# Patient Record
Sex: Female | Born: 1950
Health system: Southern US, Community
[De-identification: ages and names within clinical notes are randomized; demographics above are authoritative.]

## PROBLEM LIST (undated history)

## (undated) DIAGNOSIS — T7840XA Allergy, unspecified, initial encounter: Secondary | ICD-10-CM

## (undated) DIAGNOSIS — E039 Hypothyroidism, unspecified: Secondary | ICD-10-CM

## (undated) DIAGNOSIS — F329 Major depressive disorder, single episode, unspecified: Secondary | ICD-10-CM

## (undated) DIAGNOSIS — F32A Depression, unspecified: Secondary | ICD-10-CM

## (undated) DIAGNOSIS — F419 Anxiety disorder, unspecified: Secondary | ICD-10-CM

## (undated) DIAGNOSIS — E785 Hyperlipidemia, unspecified: Secondary | ICD-10-CM

## (undated) DIAGNOSIS — H269 Unspecified cataract: Secondary | ICD-10-CM

## (undated) DIAGNOSIS — Z860100 Personal history of colon polyps, unspecified: Secondary | ICD-10-CM

## (undated) HISTORY — DX: Personal history of colonic polyps: Z86.010

## (undated) HISTORY — DX: Hyperlipidemia, unspecified: E78.5

## (undated) HISTORY — DX: Depression, unspecified: F32.A

## (undated) HISTORY — DX: Unspecified cataract: H26.9

## (undated) HISTORY — DX: Personal history of colon polyps, unspecified: Z86.0100

## (undated) HISTORY — PX: POLYPECTOMY: SHX149

## (undated) HISTORY — DX: Major depressive disorder, single episode, unspecified: F32.9

## (undated) HISTORY — DX: Anxiety disorder, unspecified: F41.9

## (undated) HISTORY — DX: Allergy, unspecified, initial encounter: T78.40XA

## (undated) HISTORY — DX: Hypothyroidism, unspecified: E03.9

## (undated) HISTORY — PX: APPENDECTOMY: SHX54

## (undated) HISTORY — PX: COLONOSCOPY: SHX174

---

## 1987-01-15 HISTORY — PX: BREAST BIOPSY: SHX20

## 1987-01-15 HISTORY — PX: LOBECTOMY: SHX5089

## 2005-10-01 ENCOUNTER — Other Ambulatory Visit: Admission: RE | Admit: 2005-10-01 | Discharge: 2005-10-01 | Payer: Self-pay | Admitting: Obstetrics and Gynecology

## 2006-05-15 ENCOUNTER — Ambulatory Visit (HOSPITAL_COMMUNITY): Admission: RE | Admit: 2006-05-15 | Discharge: 2006-05-15 | Payer: Self-pay | Admitting: Obstetrics and Gynecology

## 2006-05-15 ENCOUNTER — Encounter (INDEPENDENT_AMBULATORY_CARE_PROVIDER_SITE_OTHER): Payer: Self-pay | Admitting: Specialist

## 2006-05-22 ENCOUNTER — Ambulatory Visit: Payer: Self-pay | Admitting: Internal Medicine

## 2006-05-23 LAB — CONVERTED CEMR LAB
ALT: 25 units/L (ref 0–40)
AST: 28 units/L (ref 0–37)
Albumin: 3.9 g/dL (ref 3.5–5.2)
Basophils Absolute: 0 10*3/uL (ref 0.0–0.1)
Bilirubin, Direct: 0.1 mg/dL (ref 0.0–0.3)
Calcium: 9.3 mg/dL (ref 8.4–10.5)
Chloride: 109 meq/L (ref 96–112)
Cholesterol: 168 mg/dL (ref 0–200)
Eosinophils Absolute: 0.2 10*3/uL (ref 0.0–0.6)
GFR calc Af Amer: 133 mL/min
GFR calc non Af Amer: 110 mL/min
HDL: 50.1 mg/dL (ref >39.0)
Lymphocytes Relative: 35.8 % (ref 12.0–46.0)
MCHC: 35.2 g/dL (ref 30.0–36.0)
MCV: 87.8 fL (ref 78.0–100.0)
Neutro Abs: 2.1 10*3/uL (ref 1.4–7.7)
Platelets: 209 10*3/uL (ref 150–400)
RBC: 4.11 M/uL (ref 3.87–5.11)
Sodium: 143 meq/L (ref 135–145)
TSH: 5.59 microintl units/mL — ABNORMAL HIGH (ref 0.35–5.50)
Total CHOL/HDL Ratio: 3.4
Triglycerides: 89 mg/dL (ref 0–149)

## 2006-10-16 ENCOUNTER — Encounter: Payer: Self-pay | Admitting: Internal Medicine

## 2006-11-24 ENCOUNTER — Ambulatory Visit: Payer: Self-pay | Admitting: Internal Medicine

## 2006-11-24 DIAGNOSIS — E785 Hyperlipidemia, unspecified: Secondary | ICD-10-CM | POA: Insufficient documentation

## 2006-11-24 DIAGNOSIS — F329 Major depressive disorder, single episode, unspecified: Secondary | ICD-10-CM

## 2006-11-24 DIAGNOSIS — Z860100 Personal history of colon polyps, unspecified: Secondary | ICD-10-CM | POA: Insufficient documentation

## 2006-11-24 DIAGNOSIS — J309 Allergic rhinitis, unspecified: Secondary | ICD-10-CM | POA: Insufficient documentation

## 2006-11-24 DIAGNOSIS — F411 Generalized anxiety disorder: Secondary | ICD-10-CM | POA: Insufficient documentation

## 2006-11-24 DIAGNOSIS — E039 Hypothyroidism, unspecified: Secondary | ICD-10-CM | POA: Insufficient documentation

## 2006-11-24 DIAGNOSIS — M810 Age-related osteoporosis without current pathological fracture: Secondary | ICD-10-CM | POA: Insufficient documentation

## 2006-11-24 LAB — CONVERTED CEMR LAB
HDL: 64.7 mg/dL (ref >39.0)
TSH: 2.2 microintl units/mL (ref 0.35–5.50)
Total CHOL/HDL Ratio: 3.8
Triglycerides: 77 mg/dL (ref 0–149)

## 2006-12-01 ENCOUNTER — Telehealth: Payer: Self-pay | Admitting: Internal Medicine

## 2006-12-08 ENCOUNTER — Encounter: Payer: Self-pay | Admitting: Internal Medicine

## 2006-12-15 ENCOUNTER — Encounter: Payer: Self-pay | Admitting: Internal Medicine

## 2007-05-18 ENCOUNTER — Ambulatory Visit: Payer: Self-pay | Admitting: Internal Medicine

## 2007-05-18 LAB — CONVERTED CEMR LAB
HDL: 70.2 mg/dL (ref >39.0)
TSH: 1.61 microintl units/mL (ref 0.35–5.50)
Total CHOL/HDL Ratio: 2.5
VLDL: 12 mg/dL (ref 0–40)

## 2007-05-28 ENCOUNTER — Telehealth: Payer: Self-pay | Admitting: Internal Medicine

## 2007-07-27 ENCOUNTER — Encounter: Payer: Self-pay | Admitting: Internal Medicine

## 2008-02-01 ENCOUNTER — Encounter: Payer: Self-pay | Admitting: Internal Medicine

## 2008-02-10 ENCOUNTER — Ambulatory Visit: Payer: Self-pay | Admitting: Internal Medicine

## 2008-06-09 ENCOUNTER — Telehealth: Payer: Self-pay | Admitting: Internal Medicine

## 2008-06-17 ENCOUNTER — Encounter: Payer: Self-pay | Admitting: Internal Medicine

## 2008-06-30 ENCOUNTER — Telehealth: Payer: Self-pay | Admitting: Internal Medicine

## 2008-08-15 ENCOUNTER — Ambulatory Visit: Payer: Self-pay | Admitting: Internal Medicine

## 2008-09-27 ENCOUNTER — Telehealth: Payer: Self-pay | Admitting: Internal Medicine

## 2009-02-15 ENCOUNTER — Ambulatory Visit: Payer: Self-pay | Admitting: Internal Medicine

## 2009-08-30 ENCOUNTER — Ambulatory Visit: Payer: Self-pay | Admitting: Family Medicine

## 2009-08-30 DIAGNOSIS — N390 Urinary tract infection, site not specified: Secondary | ICD-10-CM

## 2009-08-30 LAB — CONVERTED CEMR LAB
Glucose, Urine, Semiquant: NEGATIVE
Nitrite: NEGATIVE
Specific Gravity, Urine: 1.02
pH: 7

## 2009-09-01 ENCOUNTER — Telehealth: Payer: Self-pay | Admitting: Internal Medicine

## 2009-09-11 ENCOUNTER — Ambulatory Visit: Payer: Self-pay | Admitting: Internal Medicine

## 2009-09-11 ENCOUNTER — Telehealth: Payer: Self-pay | Admitting: Internal Medicine

## 2009-09-27 ENCOUNTER — Telehealth: Payer: Self-pay | Admitting: Internal Medicine

## 2009-10-27 ENCOUNTER — Telehealth: Payer: Self-pay | Admitting: Internal Medicine

## 2009-10-30 ENCOUNTER — Encounter: Payer: Self-pay | Admitting: Internal Medicine

## 2010-01-31 ENCOUNTER — Telehealth: Payer: Self-pay | Admitting: Speech Pathology

## 2010-02-02 ENCOUNTER — Telehealth: Payer: Self-pay | Admitting: Internal Medicine

## 2010-02-03 ENCOUNTER — Encounter: Payer: Self-pay | Admitting: *Deleted

## 2010-02-04 ENCOUNTER — Encounter: Payer: Self-pay | Admitting: *Deleted

## 2010-02-05 ENCOUNTER — Ambulatory Visit: Admit: 2010-02-05 | Payer: Self-pay | Admitting: Internal Medicine

## 2010-02-13 NOTE — Progress Notes (Signed)
Summary: alendronate question  Call back at [facility] Phone [phone]   Summary of Call: Fosamax has been recalled according to TV.  Take Alendronate, a form of Fosamax.  Is it also recalled or should I drop it? Initial call taken by: Rudy Jew, RN,  September 27, 2009 1:55 PM  Follow-up for Phone Call       Follow-up by: Rudy Jew, RN,  September 29, 2009 8:09 AM    lunaware of any recall -OK to continue until further notice

## 2010-02-13 NOTE — Progress Notes (Signed)
Summary: Pt req lab results from [facility]a.  Call back at [facility] Phone [phone]   Caller: Patient Summary of Call: Pt called and is req her lab results from [facility]. Pls call back today. If a med has been called in for a uti, pls be sure to use [facility] on [address].   Initial call taken by: Lucy Antigua,  September 01, 2009 11:14 AM  Follow-up for Phone Call        Pt is asking for lab results. [phone] Follow-up by: Lynann Beaver CMA,  September 01, 2009 2:59 PM    Additional Follow-up for Phone Call Additional follow up Details #2::    pt aware [facility]. KIk Follow-up by: Duard Brady LPN,  September 01, 2009 5:33 PM  New/Updated Medications: CIPRO 500 MG TABS (CIPROFLOXACIN HCL) 1 by mouth bid Prescriptions: CIPRO 500 MG TABS (CIPROFLOXACIN HCL) 1 by mouth bid  #14 x 0   Entered by:   Duard Brady LPN   Authorized by:   Gordy Savers  MD   Signed by:   Duard Brady LPN on 29/56/2130   Method used:   Faxed to ...       [facility] # [address] (retail)       [address]       [place], [place]  [address]       Ph: [phone]       Fax: [phone]   RxID:   [id number] CIPRO 500 MG TABS (CIPROFLOXACIN HCL) 1 by mouth bid  #140 x 0   Entered by:   Duard Brady LPN   Authorized by:   Gordy Savers  MD   Signed by:   Duard Brady LPN on 63/87/5643   Method used:   Faxed to ...       [facility] # [address] (retail)       [address]       [place], [place]  [address]       Ph: [phone]       Fax: [phone]   RxID:   [id number]  generic cipro 500  #14 one BID

## 2010-02-13 NOTE — Letter (Signed)
Summary: Patient going to [facility] for now  Patient going to [facility] for now   Imported By: Maryln Gottron 11/07/2009 12:28:09  _____________________________________________________________________  External Attachment:    Type:   Image     Comment:   External Document

## 2010-02-13 NOTE — Assessment & Plan Note (Signed)
Summary: 6 month rov/njr   Vital Signs:  Patient profile:   60 year old female Weight:      114 pounds Temp:     98.1 degrees F oral BP sitting:   112 / 74  (left arm) Cuff size:   regular  Vitals Entered By: Duard Brady LPN (September 11, 2009 12:40 PM) CC: 6 mos rov - doing well Is Patient Diabetic? No   CC:  6 mos rov - doing well.  History of Present Illness: 60 year old patient who has a history of hypothyroidism, chronic depression, and dyslipidemia.  She is done quite well.  No concerns or complaints today.  She's had no laboratory test in over one year.  She continues to be without health insurance.  Her depression has been stable.  She continues to tolerate her simvastatin without difficulty.  Preventive Screening-Counseling & Management  Alcohol-Tobacco     Smoking Status: never  Allergies (verified): No Known Drug Allergies  Past History:  Past Medical History: Reviewed history from 11/24/2006 and no changes required. Allergic rhinitis Anxiety Colonic polyps, hx of Depression Hyperlipidemia Hypothyroidism Osteoporosis  Social History: Smoking Status:  never  Review of Systems  The patient denies anorexia, fever, weight loss, weight gain, vision loss, decreased hearing, hoarseness, chest pain, syncope, dyspnea on exertion, peripheral edema, prolonged cough, headaches, hemoptysis, abdominal pain, melena, hematochezia, severe indigestion/heartburn, hematuria, incontinence, genital sores, muscle weakness, suspicious skin lesions, transient blindness, difficulty walking, depression, unusual weight change, abnormal bleeding, enlarged lymph nodes, angioedema, and breast masses.    Physical Exam  General:  Well-developed,well-nourished,in no acute distress; alert,appropriate and cooperative throughout examination Head:  Normocephalic and atraumatic without obvious abnormalities. No apparent alopecia or balding. Eyes:  No corneal or conjunctival inflammation  noted. EOMI. Perrla. Funduscopic exam benign, without hemorrhages, exudates or papilledema. Vision grossly normal. Mouth:  Oral mucosa and oropharynx without lesions or exudates.  Teeth in good repair. Neck:  No deformities, masses, or tenderness noted. Lungs:  Normal respiratory effort, chest expands symmetrically. Lungs are clear to auscultation, no crackles or wheezes. Heart:  Normal rate and regular rhythm. S1 and S2 normal without gallop, murmur, click, rub or other extra sounds. Abdomen:  Bowel sounds positive,abdomen soft and non-tender without masses, organomegaly or hernias noted. Msk:  No deformity or scoliosis noted of thoracic or lumbar spine.   Pulses:  R and L carotid,radial,femoral,dorsalis pedis and posterior tibial pulses are full and equal bilaterally Extremities:  No clubbing, cyanosis, edema, or deformity noted with normal full range of motion of all joints.     Impression & Recommendations:  Problem # 1:  HYPOTHYROIDISM (ICD-244.9)  Her updated medication list for this problem includes:    Levothyroxine Sodium 50 Mcg Tabs (Levothyroxine sodium) .Marland Kitchen... 1 tablet by mouth once a day    Her updated medication list for this problem includes:    Levothyroxine Sodium 50 Mcg Tabs (Levothyroxine sodium) .Marland Kitchen... 1 tablet by mouth once a day  Problem # 2:  HYPERLIPIDEMIA (ICD-272.4)  Her updated medication list for this problem includes:    Simvastatin 80 Mg Tabs (Simvastatin) .Marland Kitchen... 1/2 once daily    Her updated medication list for this problem includes:    Simvastatin 80 Mg Tabs (Simvastatin) .Marland Kitchen... 1/2 once daily  Problem # 3:  DEPRESSION (ICD-311)  Her updated medication list for this problem includes:    Fluoxetine Hcl 40 Mg Caps (Fluoxetine hcl) ..... One daily    Her updated medication list for this problem includes:    Fluoxetine  Hcl 40 Mg Caps (Fluoxetine hcl) ..... One daily  Complete Medication List: 1)  Aspirin 81 Mg Tbec (Aspirin) .... Take 1 tablet by  mouth every morning 2)  Levothyroxine Sodium 50 Mcg Tabs (Levothyroxine sodium) .Marland Kitchen.. 1 tablet by mouth once a day 3)  Simvastatin 80 Mg Tabs (Simvastatin) .... 1/2 once daily 4)  Fluoxetine Hcl 40 Mg Caps (Fluoxetine hcl) .... One daily 5)  Alendronate Sodium 70 Mg Tabs (Alendronate sodium) .... One weekly 6)  Cipro 500 Mg Tabs (Ciprofloxacin hcl) .Marland Kitchen.. 1 by mouth bid  Other Orders: Venipuncture ([address]) TLB-TSH (Thyroid Stimulating Hormone) (84443-TSH) TLB-BMP (Basic Metabolic Panel-BMET) (80048-METABOL) TLB-Hepatic/Liver Function Pnl (80076-HEPATIC)  Patient Instructions: 1)  Please schedule a follow-up appointment in 6 months. 2)  It is important that you exercise regularly at least 20 minutes 5 times a week. If you develop chest pain, have severe difficulty breathing, or feel very tired , stop exercising immediately and seek medical attention. 3)  Schedule your mammogram. 4)  Take calcium +Vitamin D daily. Prescriptions: ALENDRONATE SODIUM 70 MG TABS (ALENDRONATE SODIUM) one weekly  #12 x 6   Entered and Authorized by:   Gordy Savers  MD   Signed by:   Gordy Savers  MD on 09/11/2009   Method used:   [facility]ronically to        [facility] # [id number]* (retail)       [address]       [place], [place]  [address]       Ph: [phone]       Fax: [phone]   RxID:   [id number] FLUOXETINE HCL 40 MG CAPS (FLUOXETINE HCL) one daily  #90 x 6   Entered and Authorized by:   Gordy Savers  MD   Signed by:   Gordy Savers  MD on 09/11/2009   Method used:   Electronically to        [facility] # [id number]* (retail)       [address]       [place], [place]  [address]       Ph: [phone]       Fax: [phone]   RxID:   [id number] SIMVASTATIN 80 MG  TABS (SIMVASTATIN) 1/2 once daily  #90 x 4   Entered and Authorized by:   Gordy Savers  MD   Signed by:   Gordy Savers  MD on 09/11/2009   Method used:    Electronically to        [facility] # [address]* (retail)       [address]       [place], [place]  [address]       Ph: [phone]       Fax: [phone]   RxID:   [id number] LEVOTHYROXINE SODIUM 50 MCG TABS (LEVOTHYROXINE SODIUM) 1 tablet by mouth once a day  #90 x 4   Entered and Authorized by:   Gordy Savers  MD   Signed by:   Gordy Savers  MD on 09/11/2009   Method used:   Electronically to        [facility] # [address] (retail)       [address]       [place], [place]  [address]       Ph: [phone]       Fax: [phone]   RxID:   [id number]

## 2010-02-13 NOTE — Progress Notes (Signed)
Summary: taper medication  Phone Note Call from Patient Call back at Home Phone [phone]   Caller: Patient Call For: Gordy Savers  MD Summary of Call: pt would like kim to call her back concerning taking a break for fluoxetine Initial call taken by: Heron Sabins,  October 27, 2009 9:23 AM  Follow-up for Phone Call        OK to taper to 20 mg for 2 weeks and give trial off med but to resume if signs or relapse Follow-up by: Gordy Savers  MD,  October 27, 2009 5:04 PM  Additional Follow-up for Phone Call Additional follow up Details #1::        called pt to discuss taper med. KIK Additional Follow-up by: Duard Brady LPN,  October 30, 2009 10:28 AM

## 2010-02-13 NOTE — Assessment & Plan Note (Signed)
Summary: 27mo rov/mm   Vital Signs:  Tammy [name] profile:   60 year old female Weight:      112 pounds BMI:     22.70 Temp:     98.6 degrees F oral BP sitting:   80 / 50  (left arm) Cuff size:   regular  Vitals Entered By: Raechel Ache, RN (February 15, 2009 11:36 AM) CC: 6 mo ROV   CC:  6 mo ROV.  History of Present Illness: 60 year old Tammy [name] who is seen today for follow-up of her osteoporosis, hypothyroidism, and dyslipidemia.  She  remains without health insurance.  She is doing quite well without medical concerns or complaints.  She is still unemployed searching for a job  Allergies: No Known Drug Allergies  Past History:  Past Medical History: Reviewed history from 11/24/2006 and no changes required. Allergic rhinitis Anxiety Colonic polyps, hx of Depression Hyperlipidemia Hypothyroidism Osteoporosis  Past Surgical History: Reviewed history from 11/24/2006 and no changes required. gravida one, para one, abortus zero C-section 1988 Appendectomy lobectomy 1989  Social History: Single presently unemployed, without health insurance  Review of Systems  The Tammy [name] denies anorexia, fever, weight loss, weight gain, vision loss, decreased hearing, hoarseness, chest pain, syncope, dyspnea on exertion, peripheral edema, prolonged cough, headaches, hemoptysis, abdominal pain, melena, hematochezia, severe indigestion/heartburn, hematuria, incontinence, genital sores, muscle weakness, suspicious skin lesions, transient blindness, difficulty walking, depression, unusual weight change, abnormal bleeding, enlarged lymph nodes, angioedema, and breast masses.    Physical Exam  General:  Well-developed,well-nourished,in no acute distress; alert,appropriate and cooperative throughout examination; blood pressure low normal Head:  Normocephalic and atraumatic without obvious abnormalities. No apparent alopecia or balding. Eyes:  No corneal or conjunctival inflammation noted.  EOMI. Perrla. Funduscopic exam benign, without hemorrhages, exudates or papilledema. Vision grossly normal. Mouth:  Oral mucosa and oropharynx without lesions or exudates.  Teeth in good repair. Neck:  No deformities, masses, or tenderness noted. Lungs:  Normal respiratory effort, chest expands symmetrically. Lungs are clear to auscultation, no crackles or wheezes. Heart:  Normal rate and regular rhythm. S1 and S2 normal without gallop, murmur, click, rub or other extra sounds. Abdomen:  Bowel sounds positive,abdomen soft and non-tender without masses, organomegaly or hernias noted.   Impression & Recommendations:  Problem # 1:  HYPOTHYROIDISM (ICD-244.9)  Her updated medication list for this problem includes:    Levothyroxine Sodium 50 Mcg Tabs (Levothyroxine sodium) .Marland Kitchen... 1 tablet by mouth once a day  Her updated medication list for this problem includes:    Levothyroxine Sodium 50 Mcg Tabs (Levothyroxine sodium) .Marland Kitchen... 1 tablet by mouth once a day  Problem # 2:  HYPERLIPIDEMIA (ICD-272.4)  Her updated medication list for this problem includes:    Simvastatin 80 Mg Tabs (Simvastatin) .Marland Kitchen... 1/2 once daily    Her updated medication list for this problem includes:    Simvastatin 80 Mg Tabs (Simvastatin) .Marland Kitchen... 1/2 once daily  Orders: Prescription Created Electronically [id number])  Complete Medication List: 1)  Aspirin 81 Mg Tbec (Aspirin) .... Take 1 tablet by mouth every morning 2)  Levothyroxine Sodium 50 Mcg Tabs (Levothyroxine sodium) .Marland Kitchen.. 1 tablet by mouth once a day 3)  Simvastatin 80 Mg Tabs (Simvastatin) .... 1/2 once daily 4)  Bactrim Ds 800-160 Mg Tabs (Sulfamethoxazole-trimethoprim) .... One tablet twice daily as needed 5)  Fluoxetine Hcl 40 Mg Caps (Fluoxetine hcl) .... One daily 6)  Alendronate Sodium 70 Mg Tabs (Alendronate sodium) .... One weekly  Tammy [name] Instructions: 1)  Please schedule a follow-up  appointment in 6 months. 2)  It is important that you exercise  regularly at least 20 minutes 5 times a week. If you develop chest pain, have severe difficulty breathing, or feel very tired , stop exercising immediately and seek medical attention. Prescriptions: ALENDRONATE SODIUM 70 MG TABS (ALENDRONATE SODIUM) one weekly  #12 x 6   Entered and Authorized by:   Gordy Savers  MD   Signed by:   Gordy Savers  MD on 02/15/2009   Method used:   Print then Give to Tammy [name]   RxID:   [id number] FLUOXETINE HCL 40 MG CAPS (FLUOXETINE HCL) one daily  #90 x 6   Entered and Authorized by:   Gordy Savers  MD   Signed by:   Gordy Savers  MD on 02/15/2009   Method used:   Print then Give to Tammy [name]   RxID:   [id number] SIMVASTATIN 80 MG  TABS (SIMVASTATIN) 1/2 once daily  #90 x 4   Entered and Authorized by:   Gordy Savers  MD   Signed by:   Gordy Savers  MD on 02/15/2009   Method used:   Print then Give to Tammy [name]   RxID:   [id number] LEVOTHYROXINE SODIUM 50 MCG TABS (LEVOTHYROXINE SODIUM) 1 tablet by mouth once a day  #90 x 4   Entered and Authorized by:   Gordy Savers  MD   Signed by:   Gordy Savers  MD on 02/15/2009   Method used:   Print then Give to Tammy [name]   RxID:   [id number] ALENDRONATE SODIUM 70 MG TABS (ALENDRONATE SODIUM) one weekly  #12 x 6   Entered and Authorized by:   Gordy Savers  MD   Signed by:   Gordy Savers  MD on 02/15/2009   Method used:   Electronically to        [facility] # [address]* (retail)       [address]       [place], [place]  [address]       Ph: [phone]       Fax: [phone]   RxID:   [id number] FLUOXETINE HCL 40 MG CAPS (FLUOXETINE HCL) one daily  #90 x 6   Entered and Authorized by:   Gordy Savers  MD   Signed by:   Gordy Savers  MD on 02/15/2009   Method used:   Electronically to        [facility] # 2108* (retail)       [address]       [place], [place]   [address]       Ph: [phone]       Fax: [phone]   RxID:   [id number] SIMVASTATIN 80 MG  TABS (SIMVASTATIN) 1/2 once daily  #90 x 4   Entered and Authorized by:   Gordy Savers  MD   Signed by:   Gordy Savers  MD on 02/15/2009   Method used:   Electronically to        [facility] # [address]* (retail)       [address]       [place], [place]  [address]       Ph: [phone]       Fax: [phone]   RxID:   [id number] LEVOTHYROXINE SODIUM 50 MCG TABS (LEVOTHYROXINE SODIUM) 1 tablet by mouth once a day  #90 x 4  Entered and Authorized by:   Gordy Savers  MD   Signed by:   Gordy Savers  MD on 02/15/2009   Method used:   Electronically to        [facility] # [address] (retail)       [address]       [place], [place]  [address]       Ph: [phone]       Fax: [phone]   RxID:   [id number]

## 2010-02-13 NOTE — Progress Notes (Signed)
Summary: wont be getting labs  Phone Note Outgoing Call   Call placed by: Rita Ohara Call placed to: Patient Summary of Call: Patient said she couldn't afford labs right now so she will not be returning back for them. Initial call taken by: Duard Brady LPN,  September 11, 2009 5:26 PM  Follow-up for Phone Call        noted Follow-up by: Gordy Savers  MD,  September 11, 2009 5:32 PM

## 2010-02-15 NOTE — Progress Notes (Signed)
Summary: Pt cx her sch dexa scan appt. Too Expensive  Phone Note Call from Patient   Caller: Patient Summary of Call: Pt called and had been sch for dexa scan on 02/05/10 at 11:30am. Pt called back and cx the dexa scan because she said that it is too expensive and she does not have insurance. According to [facility], the cost is $400.    Initial call taken by: Lucy Antigua,  February 02, 2010 11:36 AM  Follow-up for Phone Call        OK to cancel and reschedule later Follow-up by: Gordy Savers  MD,  February 02, 2010 5:02 PM

## 2010-02-15 NOTE — Progress Notes (Signed)
Summary: wants referral  Phone Note Call from Patient Call back at Home Phone [phone]   Caller: Patient---live call Summary of Call: pt would like orders to have a colonosco-py and dexa done. please send orders. Initial call taken by: Warnell Forester,  January 31, 2010 1:33 PM  Follow-up for Phone Call        ok Follow-up by: Gordy Savers  MD,  February 01, 2010 12:40 PM  Additional Follow-up for Phone Call Additional follow up Details #1::        orders done and sent to Nadie Fiumara   [facility] Additional Follow-up by: Duard Brady LPN,  February 01, 2010 2:23 PM

## 2010-03-11 ENCOUNTER — Encounter: Payer: Self-pay | Admitting: Internal Medicine

## 2010-03-12 ENCOUNTER — Encounter (INDEPENDENT_AMBULATORY_CARE_PROVIDER_SITE_OTHER): Payer: Self-pay | Admitting: Internal Medicine

## 2010-03-12 NOTE — Progress Notes (Signed)
  Subjective:    Patient ID: Tammy [name], female    DOB: 1950-05-31, 60 y.o.   MRN: [id number]  HPI    Review of Systems     Objective:   Physical Exam        Assessment & Plan:

## 2010-05-15 ENCOUNTER — Other Ambulatory Visit: Payer: Self-pay | Admitting: Internal Medicine

## 2010-05-15 DIAGNOSIS — Z1231 Encounter for screening mammogram for malignant neoplasm of breast: Secondary | ICD-10-CM

## 2010-05-29 NOTE — Assessment & Plan Note (Signed)
[facility]                            [facility] OFFICE NOTE   NAME:Tammy [name], Tammy [name]                          MRN:          [id number]  DATE:05/22/2006                            DOB:          24-Feb-1950    A 60 year old female seen today to establish with our practice.  She has  a long history of stable depression, osteoporosis, seasonal allergic  rhinitis.  She has chronic polyps and a history of hypothyroidism.  She  is followed locally by Dr. Tenny Craw and also with [facility].  She is gravida 1 para 1 abortus 0.  Status post C-  section in 1988.  She has had a remote appendectomy in 1965, and a  lumpectomy for benign disease in 1989.   SHE HAS NO KNOWN ALLERGY.   MEDICAL REGIMEN:  Reviewed.   REVIEW OF SYSTEMS:  Exam was negative.  Her last colonoscopy was in the  summer of 2007.   SOCIAL HISTORY:  She is married, relocated from [place], [place].  One son age 46.   FAMILY HISTORY:  Both parents are living, father age 36 with arthritis,  gout, and a history of hypercholesterolemia, mother with hypothyroidism,  osteoporosis, and hypercholesterolemia.  No siblings.   EXAMINATION:  Revealed a well-developed, thin, white female, in no acute  distress.  Blood pressure is 110/70.  Fundi, ears, nose, and throat normal.  NECK:  No bruits or adenopathy.  CHEST:  Was clear.  CARDIOVASCULAR EXAM:  Normal heart sounds.  No murmurs.  ABDOMEN:  Benign.  No bruits, organomegaly, or masses.  EXTREMITIES:  Revealed intact peripheral pulses.  NEUROLOGIC:  Negative.   IMPRESSION:  1. Osteoporosis.  2. Hypothyroidism.  3. Hypercholesterolemia.   DISPOSITION:  1. She will return at her convenience for fasting panel.  2. Medical regimen unchanged.  3. We will reassess in 6 months.     Gordy Savers, MD  Electronically Signed    PFK/MedQ  DD: 05/22/2006  DT: 05/22/2006  Job #: [id number]

## 2010-05-29 NOTE — Op Note (Signed)
NAME:  Tammy [name], MOILANEN                 ACCOUNT NO.:  [id number]   MEDICAL RECORD NO.:  [id number]          PATIENT TYPE:  AMB   LOCATION:  [facility]                           FACILITY:  [facility]   PHYSICIAN:  Miguel Aschoff, M.D.       DATE OF BIRTH:  1950/11/30   DATE OF PROCEDURE:  05/15/2006  DATE OF DISCHARGE:                               OPERATIVE REPORT   PREOPERATIVE DIAGNOSIS:  Post menopausal bleeding.   POSTOPERATIVE DIAGNOSES:  1. Atrophic endometritis.  2. Atrophic vaginitis.   SURGEON:  Miguel Aschoff, M.D.   ANESTHESIA:  General anesthesia.   COMPLICATIONS:  None.   INDICATIONS FOR PROCEDURE:  The patient is a 60 year old white female  with a history of post menopausal bleeding with no regular menses now  for several years.  In view of the irregular bleeding, she presents now  to undergo hysteroscopy and D&C in an effort to identify the source of  bleeding and correct it.  The risks and limitations of the procedure  were discussed with the patient.   DESCRIPTION OF PROCEDURE:  Patient was taken to the operating room,  placed in the supine position.  General anesthesia was administered  without difficulty.  She was then placed in the dorsal lithotomy  position, prepped and draped in the usual sterile fashion.  Examination  under anesthesia revealed normal external genitalia, normal Bartholin's  and Skene's glands.  Vaginal vault revealed signs of atrophy, most  noticeable at the apex of the vagina and reflection of the vagina to the  cervix.  No gross lesions were noted other than there is atrophy which  appeared to be erythematous and when swabbed, bled easily.  The cervix  contained no gross lesions.  The uterus was small, regular size and  shape.  The adnexa revealed no masses.  At this point, speculum was  placed into the vaginal vault.  The anterior cervical lip was grasped  with the tenaculum.  The endocervical canal was dilated until a #23  Pratt dilator could be passed.  At  this point, the diagnostic  hysteroscope was advanced through the endocervical canal.  No  endocervical lesions were noted.  Inspection of the endometrial cavity  revealed the cavity to be significantly atrophic with areas of petechial  hemorrhage.  There were no polyps or submucous myomas.  Findings within  the cavity appear to be consistent with atrophic endometritis.  At this  point, with no other abnormalities being noted, the hysteroscope was  removed.  Vigorous sharp curettage was carried out and the scan amount  of tissue that was obtained was sent for histologic study.   ESTIMATED BLOOD LOSS:  Approximately 10 mL.   Following completion of the curettage and collection of the specimen,  the cervix was injected with 10 mL of 1% Xylocaine.  Patient was then  reversed from the anesthetic and taken to the recovery room in  satisfactory condition.   PLAN:  Patient to be discharged home.   MEDICATIONS FOR HOME:  Darvocet N 100 one q.4h. p.r.n. pain.   She is instructed to  call the office for pathology report on May 20, 2006.  She is to call if there are any problems such as fever, pain or  heavy bleeding.      Miguel Aschoff, M.D.  Electronically Signed     AR/MEDQ  D:  05/15/2006  T:  05/15/2006  Job:  [id number]

## 2010-05-30 ENCOUNTER — Ambulatory Visit
Admission: RE | Admit: 2010-05-30 | Discharge: 2010-05-30 | Disposition: A | Discharge status: Home / Self Care | Payer: Self-pay | Source: Ambulatory Visit | Attending: Internal Medicine | Admitting: Internal Medicine

## 2010-05-30 DIAGNOSIS — Z1231 Encounter for screening mammogram for malignant neoplasm of breast: Secondary | ICD-10-CM

## 2010-07-14 ENCOUNTER — Emergency Department (HOSPITAL_COMMUNITY)
Admission: EM | Admit: 2010-07-14 | Discharge: 2010-07-14 | Disposition: A | Discharge status: Home / Self Care | Payer: Self-pay | Attending: Emergency Medicine | Admitting: Emergency Medicine

## 2010-07-14 DIAGNOSIS — E7800 Pure hypercholesterolemia, unspecified: Secondary | ICD-10-CM | POA: Insufficient documentation

## 2010-07-14 DIAGNOSIS — R300 Dysuria: Secondary | ICD-10-CM | POA: Insufficient documentation

## 2010-07-14 DIAGNOSIS — N390 Urinary tract infection, site not specified: Secondary | ICD-10-CM | POA: Insufficient documentation

## 2010-07-14 LAB — URINE MICROSCOPIC-ADD ON

## 2010-07-14 LAB — URINALYSIS, ROUTINE W REFLEX MICROSCOPIC
Bilirubin Urine: NEGATIVE
Ketones, ur: NEGATIVE mg/dL
Nitrite: NEGATIVE
pH: 7 (ref 5.0–8.0)

## 2010-11-29 ENCOUNTER — Other Ambulatory Visit: Payer: Self-pay | Admitting: Internal Medicine

## 2011-02-20 ENCOUNTER — Telehealth: Payer: Self-pay | Admitting: *Deleted

## 2011-02-20 NOTE — Telephone Encounter (Signed)
Patient called and stated that Dr.Lalonde told her to use O2 @ night, since her COPD diagnosis in December. She said that she breaths through her mouth when she sleeps, she thinks the oxygen is making her nose bleed from drying it out so badly, it doesn't stay in her nose anyway(she finds it on the floor when she wakes up) so she isn't really getting the oxygen anyway overnight and she is doing just fine. Wants to know if it is really necessary that she continue to use the oxygen? Please advise. Thanks.

## 2011-02-20 NOTE — Telephone Encounter (Signed)
Pt scheduled fasting med ck for 02/28/11.

## 2011-02-20 NOTE — Telephone Encounter (Signed)
She was last seen in June, just prior to her hospitalization. She needs to schedule 30 min med check OV.  If she doesn't see another MD for monitoring of her lipids (she is on simvastatin), then she should come fasting

## 2011-02-28 ENCOUNTER — Other Ambulatory Visit: Payer: Self-pay

## 2011-02-28 ENCOUNTER — Encounter: Payer: Self-pay | Admitting: Family Medicine

## 2011-03-12 ENCOUNTER — Telehealth: Payer: Self-pay

## 2011-03-12 NOTE — Progress Notes (Signed)
This encounter was created in error - please disregard.

## 2011-03-12 NOTE — Telephone Encounter (Signed)
Opened in error

## 2011-03-13 ENCOUNTER — Encounter: Payer: Self-pay | Admitting: Family Medicine

## 2011-03-13 ENCOUNTER — Ambulatory Visit (INDEPENDENT_AMBULATORY_CARE_PROVIDER_SITE_OTHER): Payer: Self-pay | Admitting: Family Medicine

## 2011-03-13 VITALS — BP 110/70 | Temp 98.2°F | Wt 119.0 lb

## 2011-03-13 DIAGNOSIS — R300 Dysuria: Secondary | ICD-10-CM

## 2011-03-13 LAB — POCT URINALYSIS DIPSTICK
Ketones, UA: 15
Nitrite, UA: NEGATIVE
pH, UA: 7

## 2011-03-13 MED ORDER — SULFAMETHOXAZOLE-TRIMETHOPRIM 800-160 MG PO TABS: 1.0000 | ORAL_TABLET | Freq: Two times a day (BID) | ORAL | Status: DC

## 2011-03-13 NOTE — Progress Notes (Signed)
  Subjective:    Patient ID: Tammy [name], female    DOB: [birth year], 61 y.o.   MRN: [id number]  HPI  Patient seen with urine frequency and burning with urination past 48 hours. She also noticed strong odor of urine. Similar symptoms of UTI in the past. She denies any fever, chills, or back pain. No nausea or vomiting. She has taken sulfa medications in the past without difficulty.   Review of Systems  Constitutional: Negative for fever, chills and appetite change.  Gastrointestinal: Negative for nausea, vomiting, abdominal pain, diarrhea and constipation.  Genitourinary: Positive for dysuria and frequency.  Musculoskeletal: Negative for back pain.  Neurological: Negative for dizziness.       Objective:   Physical Exam  Constitutional: She appears well-developed and well-nourished.  Cardiovascular: Normal rate and regular rhythm.   Pulmonary/Chest: Effort normal and breath sounds normal. No respiratory distress. She has no wheezes. She has no rales.  Musculoskeletal:       No CVA tenderness          Assessment & Plan:  Uncomplicated cystitis. Septra DS one twice a day for 5 days

## 2011-03-13 NOTE — Patient Instructions (Signed)

## 2011-03-18 ENCOUNTER — Telehealth: Payer: Self-pay | Admitting: Internal Medicine

## 2011-03-18 MED ORDER — CIPROFLOXACIN HCL 500 MG PO TABS: 500.0000 mg | ORAL_TABLET | Freq: Two times a day (BID) | ORAL | Status: DC

## 2011-03-18 NOTE — Telephone Encounter (Signed)
Generic Cipro 500 mg #14 one twice daily 

## 2011-03-18 NOTE — Telephone Encounter (Signed)
[facility]

## 2011-03-18 NOTE — Telephone Encounter (Signed)
Pt called and said that she was given an abx for bladder inf on 03/13/11, by Dr Caryl Never. Pt says that she has finished abx, but still has some lingering symptoms from inf. Pt is req another round of abx to be called in to the [facility] on [address]. Pls notify pt if med called in.

## 2011-03-18 NOTE — Telephone Encounter (Signed)
Please advise 

## 2011-03-19 ENCOUNTER — Telehealth: Payer: Self-pay | Admitting: Internal Medicine

## 2011-03-19 NOTE — Telephone Encounter (Signed)
Okay to switch to Dr. Caryl Never;  discontinue Cipro;  would not recommend restarting Septra DS since this was not effective initially;  consider Macrobid-  will defer to Dr. Caryl Never tomorrow.

## 2011-03-19 NOTE — Telephone Encounter (Signed)
Pt called and said that she does not like the Cipro abx that was prescribed to her. Pt said that its making her head feel floaty, like she can not concentrate. Pt is req to go back on abx that Dr Caryl Never prescribed for pt last Wednesday and is req to change pcp from Dr Amador Cunas to Dr Caryl Never.

## 2011-03-19 NOTE — Telephone Encounter (Signed)
If symptoms persist we really need urine cx to clarify though i recognize she has no insurance.  Agree with Dr Kirtland Bouchard to change to Macrobid one bid for 5 days and will need repeat UA and cx if symptoms not cleared with that.

## 2011-03-20 MED ORDER — NITROFURANTOIN MONOHYD MACRO 100 MG PO CAPS: 100.0000 mg | ORAL_CAPSULE | Freq: Two times a day (BID) | ORAL | Status: AC

## 2011-03-20 NOTE — Telephone Encounter (Signed)
Spoke with pt - informed ok to switch pcp  Also gave dr. Lucie Leather instructions about med and sx - will need to be seen if sx return Verbalized understanding [facility]  To hollie to make pcp change

## 2011-03-20 NOTE — Telephone Encounter (Signed)
PCP changed

## 2011-09-30 ENCOUNTER — Telehealth: Payer: Self-pay | Admitting: Family Medicine

## 2011-09-30 NOTE — Telephone Encounter (Signed)
Caller: Artia/Patient; Patient Name: Tammy [name]; PCP: Evelena Peat [facility]); Best Callback Phone Number: [phone] States is taking Zoloft but is no longer working. Has increased anxiety, nightmares for 1 month. Has been weaning self off medicine as is wanting a new medicine. Is presently taking Zoloft 50mg  daily and states is going to stop medicine next week. Denies harmful behavior. Advised appointment and scheduled for 9-17 at 0945 with Dr. Caryl Never.

## 2011-10-01 ENCOUNTER — Encounter: Payer: Self-pay | Admitting: Family Medicine

## 2011-10-01 ENCOUNTER — Ambulatory Visit (INDEPENDENT_AMBULATORY_CARE_PROVIDER_SITE_OTHER): Payer: Self-pay | Admitting: Family Medicine

## 2011-10-01 VITALS — BP 90/60 | Temp 98.1°F | Wt 114.0 lb

## 2011-10-01 DIAGNOSIS — F329 Major depressive disorder, single episode, unspecified: Secondary | ICD-10-CM

## 2011-10-01 DIAGNOSIS — F411 Generalized anxiety disorder: Secondary | ICD-10-CM

## 2011-10-01 DIAGNOSIS — E039 Hypothyroidism, unspecified: Secondary | ICD-10-CM

## 2011-10-01 DIAGNOSIS — F3289 Other specified depressive episodes: Secondary | ICD-10-CM

## 2011-10-01 MED ORDER — ARIPIPRAZOLE 10 MG PO TABS: 10.0000 mg | ORAL_TABLET | Freq: Every day | ORAL | Status: DC

## 2011-10-01 NOTE — Progress Notes (Signed)
  Subjective:    Patient ID: Tammy [name], female    DOB: 12-11-1950, 61 y.o.   MRN: [id number]  HPI  Patient seen with long history of anxiety and depression. She gives a story that at age 55 her father sent her to stay with relatives in [place]. She basically never turned. This was very traumatic for her. She was moved apparently from relative to relative.  She has struggled with depression and anxiety for several years. She has frequent bouts of anxiousness, depression, agitation, irritability. She has excessive energy at times. Never diagnosed with bipolar. She's been more recently on sertraline and Prozac which seemed to help briefly but seemed to always lose effect. She does not recall being on any mood stabilizers. She's also been on Effexor and Lexapro which had limited success. She's had significant counseling in the past which she feels has never helped.  She has hypothyroidism but is not clear she's had any recent lab work. She has no insurance and does hesitate to get lab work.  She is divorced. She currently lives with someone. Nonsmoker. No reported alcohol use.   Review of Systems  Constitutional: Negative for fever, appetite change, fatigue and unexpected weight change.  Respiratory: Negative for cough and shortness of breath.   Cardiovascular: Negative for chest pain, palpitations and leg swelling.  Neurological: Negative for dizziness.  Psychiatric/Behavioral: Positive for disturbed wake/sleep cycle, dysphoric mood and agitation. Negative for suicidal ideas and self-injury. The patient is nervous/anxious.        Objective:   Physical Exam  Constitutional: She is oriented to person, place, and time. She appears well-developed and well-nourished.  Neck: Neck supple. No thyromegaly present.  Cardiovascular: Normal rate and regular rhythm.   Pulmonary/Chest: Effort normal and breath sounds normal. No respiratory distress. She has no wheezes. She has no rales.  Musculoskeletal:  She exhibits no edema.  Neurological: She is alert and oriented to person, place, and time.  Psychiatric: Judgment and thought content normal.       Patient is somewhat anxious but appropriate interactions          Assessment & Plan:  Recurrent anxiety and depression. Mood disorder questionnaire administered. Positive screen. Question bipolar illness. We've recommended consideration for psychiatry and she has seen multiple psychiatrists previously. She currently has no finances for this. We discussed trial of Abilify 10 mg at night and reassess in 2-3 weeks and check TSH

## 2011-10-02 ENCOUNTER — Encounter: Payer: Self-pay | Admitting: Family Medicine

## 2011-10-02 ENCOUNTER — Telehealth: Payer: Self-pay | Admitting: *Deleted

## 2011-10-02 NOTE — Telephone Encounter (Signed)
[facility] faxed over a note, abilify #30, $725.  Pt does not have insurance, requesting alternative

## 2011-10-02 NOTE — Telephone Encounter (Signed)
Tammy [name], please address the rx portion of this message.  Tammy [name], can you please help this lady to reschedule the appt? Thank you.

## 2011-10-03 ENCOUNTER — Telehealth: Payer: Self-pay | Admitting: Family Medicine

## 2011-10-03 NOTE — Telephone Encounter (Signed)
Caller: Tsuyako/Patient; Phone: [phone]; Reason for Call: Abilify called in to [facility] [place], is very expensive ($700 per month with discount); can Fluoxetine be called in instead?  Please call pt as soon as possible to advise.

## 2011-10-03 NOTE — Telephone Encounter (Signed)
Please advise 

## 2011-10-03 NOTE — Telephone Encounter (Signed)
Tammy [name], I also sent you this a day or two ago via My Chart. It is in your Pt Advice folder.

## 2011-10-04 NOTE — Telephone Encounter (Signed)
Dr Caryl Never, please advise on Abilify being too expensive $725.00  Alternate generic requested

## 2011-10-04 NOTE — Telephone Encounter (Signed)
Spoke with patient.  She went to see psychiatrist with [facility] and started on Prozac and Lithium.

## 2011-10-08 NOTE — Addendum Note (Signed)
Addended by: Melchor Amour on: 10/08/2011 01:43 PM   Modules accepted: Orders

## 2011-10-30 ENCOUNTER — Encounter: Payer: Self-pay | Admitting: Family Medicine

## 2011-11-04 ENCOUNTER — Encounter: Payer: Self-pay | Admitting: Family Medicine

## 2011-11-04 ENCOUNTER — Ambulatory Visit (INDEPENDENT_AMBULATORY_CARE_PROVIDER_SITE_OTHER): Payer: Self-pay | Admitting: Family Medicine

## 2011-11-04 VITALS — BP 100/68 | HR 72 | Temp 98.4°F | Resp 12 | Ht 59.0 in | Wt 113.0 lb

## 2011-11-04 DIAGNOSIS — Z0000 Encounter for general adult medical examination without abnormal findings: Secondary | ICD-10-CM

## 2011-11-04 DIAGNOSIS — E039 Hypothyroidism, unspecified: Secondary | ICD-10-CM

## 2011-11-04 DIAGNOSIS — E785 Hyperlipidemia, unspecified: Secondary | ICD-10-CM

## 2011-11-04 LAB — HEPATIC FUNCTION PANEL
ALT: 17 U/L (ref 0–35)
AST: 26 U/L (ref 0–37)
Alkaline Phosphatase: 72 U/L (ref 39–117)
Bilirubin, Direct: 0.1 mg/dL (ref 0.0–0.3)
Total Bilirubin: 1.4 mg/dL — ABNORMAL HIGH (ref 0.3–1.2)
Total Protein: 7.6 g/dL (ref 6.0–8.3)

## 2011-11-04 LAB — LIPID PANEL: Cholesterol: 178 mg/dL (ref 0–200)

## 2011-11-04 NOTE — Progress Notes (Addendum)
  Subjective:    Patient ID: Tammy [name], female    DOB: 11-02-1950, 61 y.o.   MRN: [id number]  HPI  Patient here for wellness visit. Recently refered to psychiatry for concern for bipolar illness. This was confirmed and patient now on lithium as well as low-dose fluoxetine. She feels much more stable and less agitated. She reportedly had recent thyroid panel per psychiatrist and reportedly normal. Her other medical problems include history of hyperlipidemia and hypothyroidism. She remains a levothyroxine. Last tetanus 2009. Declines flu vaccine. She reportedly had colonoscopy in [place] around April 2013. Mammogram May 2012. She plans to get every other year. Declines shingles vaccine. Pap smear last year reportedly normal. No history of reported abnormal Pap smears.  Past Medical History  Diagnosis Date  . Allergic rhinitis   . Anxiety   . History of colonic polyps   . Depression   . Hyperlipemia   . Hypothyroidism   . Osteoporosis    Past Surgical History  Procedure Date  . Cesarean section 1988  . Appendectomy   . Lobectomy 1989    reports that she has never smoked. She does not have any smokeless tobacco history on file. Her alcohol and drug histories not on file. family history includes Gout in her father; Hyperlipidemia in her father and unspecified family member; Hypothyroidism in her mother; and Osteoporosis in her mother. No Known Allergies    Review of Systems  Constitutional: Negative for fever, activity change, appetite change, fatigue and unexpected weight change.  HENT: Negative for hearing loss, ear pain, sore throat and trouble swallowing.   Eyes: Negative for visual disturbance.  Respiratory: Negative for cough and shortness of breath.   Cardiovascular: Negative for chest pain and palpitations.  Gastrointestinal: Negative for abdominal pain, diarrhea, constipation and blood in stool.  Genitourinary: Negative for dysuria and hematuria.  Musculoskeletal:  Negative for myalgias, back pain and arthralgias.  Skin: Negative for rash.  Neurological: Negative for dizziness, syncope and headaches.  Hematological: Negative for adenopathy.  Psychiatric/Behavioral: Negative for confusion and dysphoric mood.       Objective:   Physical Exam  Constitutional: She is oriented to person, place, and time. She appears well-developed and well-nourished. No distress.  HENT:  Head: Normocephalic and atraumatic.  Mouth/Throat: Oropharynx is clear and moist.       Minimal cerumen in both ear canals  Eyes: Pupils are equal, round, and reactive to light.  Neck: Neck supple. No thyromegaly present.  Cardiovascular: Normal rate and regular rhythm.   No murmur heard. Pulmonary/Chest: Effort normal and breath sounds normal. No respiratory distress. She has no wheezes. She has no rales.  Abdominal: Soft. She exhibits no mass. There is no tenderness.  Genitourinary:       Breasts symmetric without mass.  Musculoskeletal: She exhibits no edema.  Neurological: She is alert and oriented to person, place, and time. No cranial nerve deficit.  Skin: No rash noted.  Psychiatric: Her behavior is normal. Judgment and thought content normal.          Assessment & Plan:  Health maintenance. Tetanus up-to-date. Colonoscopy up to date. Patient declines flu vaccine and shingles vaccine. Will need Pneumovax by age 45. Obtain recent labs through [facility]. Check hepatic and lipid panel.  Get recent labs with thyroid function.

## 2011-11-04 NOTE — Patient Instructions (Addendum)
Consider repeat mammogram by next year Continue calcium and vitamin D supplements with recommended calcium 1200 mg daily and vitamin D 800 international units daily. Continue regular weightbearing exercise such as walking

## 2011-12-19 ENCOUNTER — Other Ambulatory Visit: Payer: Self-pay | Admitting: *Deleted

## 2011-12-19 MED ORDER — LEVOTHYROXINE SODIUM 50 MCG PO TABS: 50.0000 ug | ORAL_TABLET | Freq: Every day | ORAL | Status: DC

## 2012-02-29 ENCOUNTER — Other Ambulatory Visit: Payer: Self-pay

## 2012-05-13 ENCOUNTER — Encounter: Payer: Self-pay | Admitting: Family Medicine

## 2012-05-13 ENCOUNTER — Other Ambulatory Visit: Payer: Self-pay

## 2012-05-13 ENCOUNTER — Ambulatory Visit (INDEPENDENT_AMBULATORY_CARE_PROVIDER_SITE_OTHER): Payer: Self-pay | Admitting: Family Medicine

## 2012-05-13 VITALS — BP 100/60 | Temp 98.6°F | Wt 119.0 lb

## 2012-05-13 DIAGNOSIS — Z1231 Encounter for screening mammogram for malignant neoplasm of breast: Secondary | ICD-10-CM

## 2012-05-13 DIAGNOSIS — E039 Hypothyroidism, unspecified: Secondary | ICD-10-CM

## 2012-05-13 DIAGNOSIS — E785 Hyperlipidemia, unspecified: Secondary | ICD-10-CM

## 2012-05-13 MED ORDER — SIMVASTATIN 20 MG PO TABS: 20.0000 mg | ORAL_TABLET | Freq: Every day | ORAL | Status: DC

## 2012-05-13 NOTE — Progress Notes (Signed)
  Subjective:    Patient ID: Tammy [name], female    DOB: 01-Oct-1950, 62 y.o.   MRN: [id number]  HPI Medical followup Patient has history of bipolar disorder followed by psychiatry. This is currently stable on lithium and she also takes fluoxetine. Recently had reported TSH 4.7 which was slightly elevated based on that labs reference range. We did not have any confirmed blood levels. Patient currently takes levothyroxin  She takes simvastatin currently 40 mg daily and requesting change in dosage to 20 mg. She's not any myalgias or other side effects. Overall fairly low risk for CAD.  Past Medical History  Diagnosis Date  . Allergic rhinitis   . Anxiety   . History of colonic polyps   . Depression   . Hyperlipemia   . Hypothyroidism   . Osteoporosis    Past Surgical History  Procedure Laterality Date  . Cesarean section  1988  . Appendectomy    . Lobectomy  1989    reports that she has never smoked. She does not have any smokeless tobacco history on file. Her alcohol and drug histories are not on file. family history includes Gout in her father; Hyperlipidemia in her father and unspecified family member; Hypothyroidism in her mother; and Osteoporosis in her mother. No Known Allergies    Review of Systems  Constitutional: Negative for fatigue.  Eyes: Negative for visual disturbance.  Respiratory: Negative for cough, chest tightness, shortness of breath and wheezing.   Cardiovascular: Negative for chest pain, palpitations and leg swelling.  Neurological: Negative for dizziness, seizures, syncope, weakness, light-headedness and headaches.       Objective:   Physical Exam  Constitutional: She appears well-developed and well-nourished. No distress.  Neck: Neck supple. No thyromegaly present.  Cardiovascular: Normal rate and regular rhythm.   Pulmonary/Chest: Effort normal and breath sounds normal. No respiratory distress. She has no wheezes. She has no rales.   Musculoskeletal: She exhibits no edema.          Assessment & Plan:  #1 hyperlipidemia. Try reducing simvastatin 20 mg daily. Routine followup 6 months and repeat fasting lipid panel then. #2 reported abnormal thyroid blood test. Minimally elevated TSH. We have requested that patient have psychiatrist send copies of labs here. We cannot adjust her thyroid medication without copy of recent labs

## 2012-05-20 ENCOUNTER — Encounter: Payer: Self-pay | Admitting: Family Medicine

## 2012-05-21 NOTE — Telephone Encounter (Signed)
Recent TSH 8 per labs from psychiatrist.  These will be scanned in. Increase Levothyroxine to 75 mcg daily and repeat TSH 3 months.

## 2012-05-26 NOTE — Telephone Encounter (Signed)
Pt would like a call concerning MD's new  Instructions for meds/ Pls call pt at [phone].

## 2012-05-27 MED ORDER — LEVOTHYROXINE SODIUM 75 MCG PO TABS: 75.0000 ug | ORAL_TABLET | Freq: Every day | ORAL | Status: DC

## 2012-05-27 NOTE — Telephone Encounter (Signed)
Pt has OV in October, she would like to have her TSH checked at that time.

## 2012-06-04 ENCOUNTER — Ambulatory Visit
Admission: RE | Admit: 2012-06-04 | Discharge: 2012-06-04 | Disposition: A | Discharge status: Home / Self Care | Payer: No Typology Code available for payment source | Source: Ambulatory Visit

## 2012-06-04 DIAGNOSIS — Z1231 Encounter for screening mammogram for malignant neoplasm of breast: Secondary | ICD-10-CM

## 2012-11-12 ENCOUNTER — Ambulatory Visit: Payer: Self-pay | Admitting: Family Medicine

## 2012-11-16 ENCOUNTER — Ambulatory Visit (INDEPENDENT_AMBULATORY_CARE_PROVIDER_SITE_OTHER): Payer: Self-pay | Admitting: Family Medicine

## 2012-11-16 ENCOUNTER — Encounter: Payer: Self-pay | Admitting: Family Medicine

## 2012-11-16 VITALS — BP 98/66 | HR 67 | Temp 97.9°F | Wt 115.0 lb

## 2012-11-16 DIAGNOSIS — E785 Hyperlipidemia, unspecified: Secondary | ICD-10-CM

## 2012-11-16 DIAGNOSIS — E039 Hypothyroidism, unspecified: Secondary | ICD-10-CM

## 2012-11-16 LAB — LIPID PANEL
LDL Cholesterol: 113 mg/dL — ABNORMAL HIGH (ref 0–99)
Total CHOL/HDL Ratio: 3
VLDL: 20.8 mg/dL (ref 0.0–40.0)

## 2012-11-16 LAB — HEPATIC FUNCTION PANEL
Alkaline Phosphatase: 69 U/L (ref 39–117)
Bilirubin, Direct: 0.1 mg/dL (ref 0.0–0.3)
Total Bilirubin: 1.8 mg/dL — ABNORMAL HIGH (ref 0.3–1.2)
Total Protein: 7.4 g/dL (ref 6.0–8.3)

## 2012-11-16 NOTE — Progress Notes (Signed)
  Subjective:    Patient ID: Tammy [name], female    DOB: 06-17-1950, 62 y.o.   MRN: [id number]  HPI Medical followup. Patient has history of hypothyroidism, hyperlipidemia, bipolar disorder. She is followed by psychiatry. She is maintained on lithium. She thinks she had recent lithium level but is not sure. She is compliant with thyroid medication. Does have some recent mild weight gain. No cold intolerance. No constipation. No alopecia.  Hyperlipidemia treated with simvastatin. No myalgias. No cardiac history. No history of peripheral vascular disease. Needs followup lab work. Compliant with all medications. She declines flu vaccine secondary to cost.  Past Medical History  Diagnosis Date  . Allergic rhinitis   . Anxiety   . History of colonic polyps   . Depression   . Hyperlipemia   . Hypothyroidism   . Osteoporosis    Past Surgical History  Procedure Laterality Date  . Cesarean section  1988  . Appendectomy    . Lobectomy  1989    reports that she has never smoked. She does not have any smokeless tobacco history on file. Her alcohol and drug histories are not on file. family history includes Gout in her father; Hyperlipidemia in her father and another family member; Hypothyroidism in her mother; Osteoporosis in her mother. No Known Allergies    Review of Systems  Constitutional: Negative for fatigue.  Eyes: Negative for visual disturbance.  Respiratory: Negative for cough, chest tightness, shortness of breath and wheezing.   Cardiovascular: Negative for chest pain, palpitations and leg swelling.  Endocrine: Negative for polydipsia and polyuria.  Genitourinary: Negative for dysuria.  Neurological: Negative for dizziness, seizures, syncope, weakness, light-headedness and headaches.  Psychiatric/Behavioral: Negative for confusion and agitation.       Objective:   Physical Exam  Constitutional: She is oriented to person, place, and time. She appears well-developed and  well-nourished.  Neck: Neck supple. No thyromegaly present.  Cardiovascular: Normal rate and regular rhythm.  Exam reveals no gallop.   Pulmonary/Chest: Effort normal and breath sounds normal. No respiratory distress. She has no wheezes. She has no rales.  Musculoskeletal: She exhibits no edema.  Neurological: She is alert and oriented to person, place, and time.  Skin: No rash noted.  Psychiatric: She has a normal mood and affect. Her behavior is normal. Judgment and thought content normal.          Assessment & Plan:  #1 hyperlipidemia. Check lipid and hepatic panel #2 hypothyroidism. Recheck TSH in patient on lithium for bipolar disorder #3 health maintenance. Flu vaccine declined per patient secondary to cost concerns.

## 2013-05-05 ENCOUNTER — Encounter: Payer: Self-pay | Admitting: Family Medicine

## 2013-05-05 ENCOUNTER — Ambulatory Visit (INDEPENDENT_AMBULATORY_CARE_PROVIDER_SITE_OTHER): Payer: Self-pay | Admitting: Family Medicine

## 2013-05-05 VITALS — BP 104/66 | HR 72 | Temp 98.6°F | Wt 117.0 lb

## 2013-05-05 DIAGNOSIS — M810 Age-related osteoporosis without current pathological fracture: Secondary | ICD-10-CM

## 2013-05-05 DIAGNOSIS — E039 Hypothyroidism, unspecified: Secondary | ICD-10-CM

## 2013-05-05 DIAGNOSIS — E785 Hyperlipidemia, unspecified: Secondary | ICD-10-CM

## 2013-05-05 NOTE — Progress Notes (Signed)
   Subjective:    Patient ID: Tammy [name], female    DOB: 1950-09-07, 63 y.o.   MRN: [id number]  HPI Medical followup. Her chronic problems include history of hypothyroidism, hyperlipidemia, depression, osteoporosis. She is followed by psychiatry and is maintained on lithium. Psychiatry is following her lithium levels. She had thyroid functions last fall which were normal. She takes simvastatin for hyperlipidemia. Levothyroxin for hypothyroidism. Appetite and weight are stable. No chest pains. Occasional fleeting vertigo symptoms. No syncope. No recent falls. Compliant with medications  Past Medical History  Diagnosis Date  . Allergic rhinitis   . Anxiety   . History of colonic polyps   . Depression   . Hyperlipemia   . Hypothyroidism   . Osteoporosis    Past Surgical History  Procedure Laterality Date  . Cesarean section  1988  . Appendectomy    . Lobectomy  1989    reports that she has never smoked. She does not have any smokeless tobacco history on file. Her alcohol and drug histories are not on file. family history includes Gout in her father; Hyperlipidemia in her father and another family member; Hypothyroidism in her mother; Osteoporosis in her mother. No Known Allergies    Review of Systems  Constitutional: Negative for fatigue and unexpected weight change.  Eyes: Negative for visual disturbance.  Respiratory: Negative for cough, chest tightness, shortness of breath and wheezing.   Cardiovascular: Negative for chest pain, palpitations and leg swelling.  Endocrine: Negative for polydipsia and polyuria.  Neurological: Positive for dizziness. Negative for seizures, syncope, weakness, light-headedness and headaches.       Objective:   Physical Exam  Constitutional: She is oriented to person, place, and time. She appears well-developed and well-nourished. No distress.  Cardiovascular: Normal rate and regular rhythm.   Pulmonary/Chest: Effort normal and breath sounds  normal. No respiratory distress. She has no wheezes. She has no rales.  Musculoskeletal: She exhibits no edema.  Neurological: She is alert and oriented to person, place, and time. No cranial nerve deficit.  Psychiatric: She has a normal mood and affect. Her behavior is normal.          Assessment & Plan:  #1 hypothyroidism. Continue current medication. Check TSH in 6 months #2 hyperlipidemia. Lipids been stable. Recheck labs at followup #3 history of osteoporosis. She has previously taken bisphosphonates and date of last DEXA unknown. We'll repeat DEXA scan at 53. Discussed adequate amounts of calcium and vitamin D.

## 2013-05-05 NOTE — Progress Notes (Signed)
Pre visit review using our clinic review tool, if applicable. No additional management support is needed unless otherwise documented below in the visit note. 

## 2013-05-27 ENCOUNTER — Other Ambulatory Visit: Payer: Self-pay | Admitting: Family Medicine

## 2013-07-15 ENCOUNTER — Other Ambulatory Visit: Payer: Self-pay

## 2013-07-15 DIAGNOSIS — Z1231 Encounter for screening mammogram for malignant neoplasm of breast: Secondary | ICD-10-CM

## 2013-08-02 ENCOUNTER — Encounter: Payer: Self-pay | Admitting: Family Medicine

## 2013-08-02 ENCOUNTER — Ambulatory Visit (INDEPENDENT_AMBULATORY_CARE_PROVIDER_SITE_OTHER): Payer: Self-pay | Admitting: Family Medicine

## 2013-08-02 VITALS — BP 92/60 | HR 73 | Temp 98.3°F | Ht 59.0 in | Wt 120.0 lb

## 2013-08-02 DIAGNOSIS — H109 Unspecified conjunctivitis: Secondary | ICD-10-CM

## 2013-08-02 MED ORDER — SULFACETAMIDE SODIUM 10 % OP SOLN: 1.0000 [drp] | OPHTHALMIC | Status: DC

## 2013-08-02 NOTE — Progress Notes (Signed)
Pre visit review using our clinic review tool, if applicable. No additional management support is needed unless otherwise documented below in the visit note. 

## 2013-08-02 NOTE — Patient Instructions (Signed)
-  compresses  -drops if not improving  -see your eye doctor if worsening or not improving

## 2013-08-02 NOTE — Progress Notes (Signed)
No chief complaint on file.   HPI:  R eye Pain: -started 2 days ago -pain and sandy feeling in R eye, redness of eye, watering eye - now feeling better -denies: blurred vision , change in vision, headache, fevers -eye doctor is Dr. Nicki Reaper  ROS: See pertinent positives and negatives per HPI.  Past Medical History  Diagnosis Date  . Allergic rhinitis   . Anxiety   . History of colonic polyps   . Depression   . Hyperlipemia   . Hypothyroidism   . Osteoporosis     Past Surgical History  Procedure Laterality Date  . Cesarean section  1988  . Appendectomy    . Lobectomy  1989    Family History  Problem Relation Age of Onset  . Gout Father   . Hyperlipidemia Father   . Osteoporosis Mother   . Hypothyroidism Mother   . Hyperlipidemia      History   Social History  . Marital Status: Divorced    Spouse Name: N/A    Number of Children: N/A  . Years of Education: N/A   Occupational History  . unemployed    Social History Main Topics  . Smoking status: Never Smoker   . Smokeless tobacco: None  . Alcohol Use: None  . Drug Use: None  . Sexual Activity: None   Other Topics Concern  . None   Social History Narrative   Single, presently unemployed without health insurance    Current outpatient prescriptions:aspirin 81 MG tablet, Take 81 mg by mouth daily.  , Disp: , Rfl: ;  calcium-vitamin D (OSCAL) 250-125 MG-UNIT per tablet, Take 1 tablet by mouth daily., Disp: , Rfl: ;  levothyroxine (SYNTHROID, LEVOTHROID) 75 MCG tablet, Take one tablet by mouth one time daily, Disp: 90 tablet, Rfl: 2;  lithium 300 MG tablet, Take 300 mg by mouth 2 (two) times daily. Per [place] verbally, unsure of dose, Disp: , Rfl:  lithium carbonate 150 MG capsule, Take 150 mg by mouth daily. Per [facility] per pt., Disp: , Rfl: ;  Multiple Vitamins-Minerals (WOMENS 50+ MULTI VITAMIN/MIN PO), Take by mouth daily., Disp: , Rfl: ;  sertraline (ZOLOFT) 100 MG tablet, Take  100 mg by mouth daily., Disp: , Rfl: ;  simvastatin (ZOCOR) 20 MG tablet, Take one tablet by mouth at bedtime, Disp: 90 tablet, Rfl: 2 sulfacetamide (BLEPH-10) 10 % ophthalmic solution, Place 1 drop into the right eye every 3 (three) hours., Disp: 15 mL, Rfl: 0  EXAM:  Filed Vitals:   08/02/13 1502  BP: 92/60  Pulse: 73  Temp: 98.3 F (36.8 C)    Body mass index is 24.22 kg/(m^2).  GENERAL: vitals reviewed and listed above, alert, oriented, appears well hydrated and in no acute distress  HEENT: atraumatic, conjunttiva mildly erythematous R eye with mild tearing, visual acuity grossly intact, EOMI, PERRLA, no obvious abnormalities on inspection of external nose and ears  NECK: no obvious masses on inspection  MS: moves all extremities without noticeable abnormality  PSYCH: pleasant and cooperative, no obvious depression or anxiety  ASSESSMENT AND PLAN:  Discussed the following assessment and plan:  Conjunctivitis of right eye - Plan: sulfacetamide (BLEPH-10) 10 % ophthalmic solution  -we discussed possible serious and likely etiologies, workup and treatment, treatment risks and return precautions -after this discussion, Emila opted for tx per below -follow up advised with opthomology if worsens or does not improve -of course, we advised Rheagan  to return or notify a doctor  immediately if symptoms worsen or persist or new concerns arise.  .  -Patient advised to return or notify a doctor immediately if symptoms worsen or persist or new concerns arise.  Patient Instructions  -compresses  -drops if not improving  -see your eye doctor if worsening or not improving     Jossue Rubenstein R.

## 2013-08-27 ENCOUNTER — Telehealth: Payer: Self-pay | Admitting: Family Medicine

## 2013-08-27 MED ORDER — LEVOTHYROXINE SODIUM 75 MCG PO TABS: ORAL_TABLET | ORAL | Status: DC

## 2013-08-27 NOTE — Telephone Encounter (Signed)
Pt states that [facility] sent over a copy of her thyroid results to dr. Elease Hashimoto. Pt states she need an increase in her rx levothyroxine (SYNTHROID, LEVOTHROID) 75 MCG tablet. [facility] [address].

## 2013-08-27 NOTE — Telephone Encounter (Signed)
RX sent to pharmacy  

## 2013-08-30 ENCOUNTER — Telehealth: Payer: Self-pay | Admitting: Family Medicine

## 2013-08-30 NOTE — Telephone Encounter (Signed)
Pt called Friday requesting an increase in her levothyroxine (SYNTHROID, LEVOTHROID) 75 MCG tablet Pt states dr Elease Hashimoto knows about this.due to [facility] labs sent over. Message sent Friday. [facility] Pt has one tab left for tomorrow. Can you call pt to advise.

## 2013-08-30 NOTE — Telephone Encounter (Signed)
I have not yet seen any labs from Tammy [name].

## 2013-08-30 NOTE — Telephone Encounter (Signed)
Pt wants to know are you going to increase her Levothyroxine?

## 2013-08-31 NOTE — Telephone Encounter (Signed)
Informed pt to have Tammy [name] fax results to office. Pt stated the results were from March.

## 2013-10-25 ENCOUNTER — Telehealth: Payer: Self-pay | Admitting: Family Medicine

## 2013-10-25 NOTE — Telephone Encounter (Signed)
We do not manage her Lithium (she sees psychiatry) and we cannot refill this medication.

## 2013-10-25 NOTE — Telephone Encounter (Signed)
Is it okay to refill the patients  Lithium

## 2013-10-26 NOTE — Telephone Encounter (Signed)
Pt aware.

## 2013-11-04 ENCOUNTER — Ambulatory Visit: Payer: Self-pay | Admitting: Family Medicine

## 2013-11-08 ENCOUNTER — Encounter: Payer: Self-pay | Admitting: Family Medicine

## 2013-11-08 ENCOUNTER — Ambulatory Visit (INDEPENDENT_AMBULATORY_CARE_PROVIDER_SITE_OTHER): Payer: Commercial Managed Care - HMO | Admitting: Family Medicine

## 2013-11-08 VITALS — BP 100/64 | HR 68 | Temp 98.6°F | Wt 120.0 lb

## 2013-11-08 DIAGNOSIS — Z79899 Other long term (current) drug therapy: Secondary | ICD-10-CM

## 2013-11-08 DIAGNOSIS — F319 Bipolar disorder, unspecified: Secondary | ICD-10-CM | POA: Insufficient documentation

## 2013-11-08 DIAGNOSIS — E039 Hypothyroidism, unspecified: Secondary | ICD-10-CM

## 2013-11-08 DIAGNOSIS — E785 Hyperlipidemia, unspecified: Secondary | ICD-10-CM

## 2013-11-08 LAB — HEPATIC FUNCTION PANEL
ALK PHOS: 81 U/L (ref 39–117)
ALT: 17 U/L (ref 0–35)
AST: 23 U/L (ref 0–37)
Albumin: 3.8 g/dL (ref 3.5–5.2)
BILIRUBIN DIRECT: 0.1 mg/dL (ref 0.0–0.3)
BILIRUBIN TOTAL: 1.2 mg/dL (ref 0.2–1.2)
TOTAL PROTEIN: 7.7 g/dL (ref 6.0–8.3)

## 2013-11-08 LAB — BASIC METABOLIC PANEL
BUN: 15 mg/dL (ref 6–23)
CALCIUM: 9.5 mg/dL (ref 8.4–10.5)
CO2: 31 mEq/L (ref 19–32)
CREATININE: 0.8 mg/dL (ref 0.4–1.2)
Chloride: 102 mEq/L (ref 96–112)
GFR: 78.78 mL/min (ref >60.00)
Glucose, Bld: 97 mg/dL (ref 70–99)
Potassium: 3.5 mEq/L (ref 3.5–5.1)
SODIUM: 136 meq/L (ref 135–145)

## 2013-11-08 LAB — TSH: TSH: 0.97 u[IU]/mL (ref 0.35–4.50)

## 2013-11-08 LAB — LIPID PANEL
CHOL/HDL RATIO: 4
CHOLESTEROL: 186 mg/dL (ref 0–200)
HDL: 50.6 mg/dL (ref >39.00)
LDL Cholesterol: 115 mg/dL — ABNORMAL HIGH (ref 0–99)
NonHDL: 135.4
TRIGLYCERIDES: 100 mg/dL (ref 0.0–149.0)
VLDL: 20 mg/dL (ref 0.0–40.0)

## 2013-11-08 NOTE — Progress Notes (Signed)
Pre visit review using our clinic review tool, if applicable. No additional management support is needed unless otherwise documented below in the visit note. 

## 2013-11-08 NOTE — Progress Notes (Signed)
   Subjective:    Patient ID: Tammy [name], female    DOB: 12-27-1950, 63 y.o.   MRN: [id number]  HPI Patient seen for routine medical follow-up. She has history of hyperlipidemia, bipolar disorder, and hypothyroidism. She recently lost coverage for her psychiatry provider and is in transition. She needs to set up follow-up with another psychiatrist. She is treated with lithium and compliant with therapy. She takes levothyroxine for hypothyroidism. Hyperlipidemia treated with simvastatin. No history of CAD or peripheral vascular disease. Nonsmoker.  Past Medical History  Diagnosis Date  . Allergic rhinitis   . Anxiety   . History of colonic polyps   . Depression   . Hyperlipemia   . Hypothyroidism   . Osteoporosis    Past Surgical History  Procedure Laterality Date  . Cesarean section  1988  . Appendectomy    . Lobectomy  1989    reports that she has never smoked. She does not have any smokeless tobacco history on file. Her alcohol and drug histories are not on file. family history includes Gout in her father; Hyperlipidemia in her father and another family member; Hypothyroidism in her mother; Osteoporosis in her mother. No Known Allergies'    Review of Systems  Constitutional: Negative for fatigue.  Eyes: Negative for visual disturbance.  Respiratory: Negative for cough, chest tightness, shortness of breath and wheezing.   Cardiovascular: Negative for chest pain, palpitations and leg swelling.  Endocrine: Negative for polydipsia and polyuria.  Neurological: Negative for dizziness, seizures, syncope, weakness, light-headedness and headaches.       Objective:   Physical Exam  Constitutional: She appears well-developed and well-nourished.  Neck: Neck supple. No thyromegaly present.  Cardiovascular: Normal rate and regular rhythm.  Exam reveals no gallop.   No murmur heard. Pulmonary/Chest: Effort normal and breath sounds normal. No respiratory distress. She has no wheezes.  She has no rales.  Psychiatric: She has a normal mood and affect. Her behavior is normal.          Assessment & Plan:  #1 hypothyroidism. Recheck TSH. Adjust levothyroxine accordingly #2 hyperlipidemia. Repeat lipid and hepatic panel  #3 hx of bipolar disorder. She is strongly advised to go ahead and schedule with new psychiatrist.

## 2013-11-08 NOTE — Patient Instructions (Signed)
Patient to set up appointment with a new psychiatrist from the approved list by the insurance company.  They will prescribe and monitor the lithium.

## 2013-11-11 ENCOUNTER — Ambulatory Visit
Admission: RE | Admit: 2013-11-11 | Discharge: 2013-11-11 | Disposition: A | Discharge status: Home / Self Care | Payer: Commercial Managed Care - HMO | Source: Ambulatory Visit

## 2013-11-11 DIAGNOSIS — Z1231 Encounter for screening mammogram for malignant neoplasm of breast: Secondary | ICD-10-CM

## 2014-03-28 ENCOUNTER — Telehealth: Payer: Self-pay

## 2014-03-28 MED ORDER — SIMVASTATIN 20 MG PO TABS: 20.0000 mg | ORAL_TABLET | Freq: Every day | ORAL | Status: DC

## 2014-03-28 NOTE — Telephone Encounter (Signed)
Rx sent to pharmacy   

## 2014-03-28 NOTE — Telephone Encounter (Signed)
[facility] refill request for SIMVASTATIN 20 MG #90.  Note says "pt in [place] and needs ne script"

## 2014-03-30 ENCOUNTER — Other Ambulatory Visit: Payer: Self-pay | Admitting: *Deleted

## 2014-03-30 MED ORDER — LEVOTHYROXINE SODIUM 75 MCG PO TABS: ORAL_TABLET | ORAL | Status: DC

## 2014-04-18 DIAGNOSIS — Z0000 Encounter for general adult medical examination without abnormal findings: Secondary | ICD-10-CM | POA: Diagnosis not present

## 2014-06-01 ENCOUNTER — Other Ambulatory Visit: Payer: Self-pay | Admitting: Family Medicine

## 2014-06-03 ENCOUNTER — Other Ambulatory Visit: Payer: Self-pay

## 2014-06-03 MED ORDER — SIMVASTATIN 20 MG PO TABS: 20.0000 mg | ORAL_TABLET | Freq: Every day | ORAL | Status: DC

## 2014-06-03 MED ORDER — LEVOTHYROXINE SODIUM 75 MCG PO TABS: 75.0000 ug | ORAL_TABLET | Freq: Every day | ORAL | Status: DC

## 2014-06-14 DIAGNOSIS — F3130 Bipolar disorder, current episode depressed, mild or moderate severity, unspecified: Secondary | ICD-10-CM | POA: Diagnosis not present

## 2014-06-23 ENCOUNTER — Other Ambulatory Visit: Payer: Self-pay | Admitting: *Deleted

## 2014-06-23 MED ORDER — SIMVASTATIN 20 MG PO TABS: 20.0000 mg | ORAL_TABLET | Freq: Every day | ORAL | Status: DC

## 2014-07-11 ENCOUNTER — Other Ambulatory Visit: Payer: Self-pay

## 2014-09-08 DIAGNOSIS — H5210 Myopia, unspecified eye: Secondary | ICD-10-CM | POA: Diagnosis not present

## 2014-09-12 DIAGNOSIS — F3130 Bipolar disorder, current episode depressed, mild or moderate severity, unspecified: Secondary | ICD-10-CM | POA: Diagnosis not present

## 2014-10-06 ENCOUNTER — Other Ambulatory Visit: Payer: Self-pay | Admitting: Family Medicine

## 2014-11-09 ENCOUNTER — Ambulatory Visit (INDEPENDENT_AMBULATORY_CARE_PROVIDER_SITE_OTHER): Payer: Commercial Managed Care - HMO | Admitting: Family Medicine

## 2014-11-09 ENCOUNTER — Encounter: Payer: Self-pay | Admitting: Family Medicine

## 2014-11-09 VITALS — BP 100/60 | HR 85 | Temp 98.2°F | Ht <= 58 in | Wt 107.5 lb

## 2014-11-09 DIAGNOSIS — F3170 Bipolar disorder, currently in remission, most recent episode unspecified: Secondary | ICD-10-CM

## 2014-11-09 DIAGNOSIS — E785 Hyperlipidemia, unspecified: Secondary | ICD-10-CM | POA: Diagnosis not present

## 2014-11-09 DIAGNOSIS — E039 Hypothyroidism, unspecified: Secondary | ICD-10-CM | POA: Diagnosis not present

## 2014-11-09 DIAGNOSIS — Z23 Encounter for immunization: Secondary | ICD-10-CM

## 2014-11-09 LAB — LIPID PANEL
Cholesterol: 182 mg/dL (ref 0–200)
HDL: 59.5 mg/dL (ref >39.00)
LDL Cholesterol: 110 mg/dL — ABNORMAL HIGH (ref 0–99)
NONHDL: 122
Total CHOL/HDL Ratio: 3
Triglycerides: 59 mg/dL (ref 0.0–149.0)
VLDL: 11.8 mg/dL (ref 0.0–40.0)

## 2014-11-09 LAB — HEPATIC FUNCTION PANEL
ALBUMIN: 4 g/dL (ref 3.5–5.2)
ALK PHOS: 74 U/L (ref 39–117)
ALT: 21 U/L (ref 0–35)
AST: 28 U/L (ref 0–37)
BILIRUBIN DIRECT: 0.2 mg/dL (ref 0.0–0.3)
BILIRUBIN TOTAL: 0.8 mg/dL (ref 0.2–1.2)
Total Protein: 6.8 g/dL (ref 6.0–8.3)

## 2014-11-09 LAB — BASIC METABOLIC PANEL
BUN: 22 mg/dL (ref 6–23)
CALCIUM: 9.7 mg/dL (ref 8.4–10.5)
CO2: 31 mEq/L (ref 19–32)
CREATININE: 0.73 mg/dL (ref 0.40–1.20)
Chloride: 105 mEq/L (ref 96–112)
GFR: 84.77 mL/min (ref >60.00)
GLUCOSE: 98 mg/dL (ref 70–99)
Potassium: 5.5 mEq/L — ABNORMAL HIGH (ref 3.5–5.1)
SODIUM: 144 meq/L (ref 135–145)

## 2014-11-09 LAB — TSH: TSH: 1.12 u[IU]/mL (ref 0.35–4.50)

## 2014-11-09 NOTE — Addendum Note (Signed)
Addended by: Ailene Rud E on: 11/09/2014 02:21 PM   Modules accepted: Orders

## 2014-11-09 NOTE — Progress Notes (Signed)
Pre visit review using our clinic review tool, if applicable. No additional management support is needed unless otherwise documented below in the visit note. 

## 2014-11-09 NOTE — Progress Notes (Signed)
   Subjective:    Patient ID: Tammy [name], female    DOB: 11/08/1950, 64 y.o.   MRN: [id number]  HPI Medical follow-up  Hypothyroidism. Treated with levothyroxin. Compliant with therapy. No fatigue issues. No constipation.  History bipolar disorder. She is currently on Depakote and sertraline and feels that combination is working very well. She recalls having some lab done in the summertime reportedly regarding Depakote levels.  Hyperlipidemia treated with simvastatin. Compliant with medication. No history of CAD or peripheral vascular disease. Nonsmoker. She exercises a few days per week.  She just had flu vaccine. No history of documented Pneumovax or Prevnar.  Past Medical History  Diagnosis Date  . Allergic rhinitis   . Anxiety   . History of colonic polyps   . Depression   . Hyperlipemia   . Hypothyroidism   . Osteoporosis    Past Surgical History  Procedure Laterality Date  . Cesarean section  1988  . Appendectomy    . Lobectomy  1989    reports that she has never smoked. She does not have any smokeless tobacco history on file. Her alcohol and drug histories are not on file. family history includes Gout in her father; Hyperlipidemia in her father and another family member; Hypothyroidism in her mother; Osteoporosis in her mother. No Known Allergies    Review of Systems  Constitutional: Negative for fatigue.  Eyes: Negative for visual disturbance.  Respiratory: Negative for cough, chest tightness, shortness of breath and wheezing.   Cardiovascular: Negative for chest pain, palpitations and leg swelling.  Endocrine: Negative for polydipsia and polyuria.  Genitourinary: Negative for dysuria.  Neurological: Negative for dizziness, seizures, syncope, weakness, light-headedness and headaches.       Objective:   Physical Exam  Constitutional: She is oriented to person, place, and time. She appears well-developed and well-nourished.  Neck: Neck supple. No  thyromegaly present.  Cardiovascular: Normal rate and regular rhythm.   Pulmonary/Chest: Effort normal and breath sounds normal. No respiratory distress. She has no wheezes. She has no rales.  Musculoskeletal: She exhibits no edema.  Lymphadenopathy:    She has no cervical adenopathy.  Neurological: She is alert and oriented to person, place, and time.          Assessment & Plan:  #1 hypothyroidism. Recheck TSH. Continue levothyroxin. Adjust medication accordingly #2 hyperlipidemia. Continue simvastatin. Recheck lipid and hepatic panel #3 bipolar disorder followed by psychiatry. Stable currently on regimen of Depakote and sertraline #4 health maintenance. Flu vaccine already given. Recommended Prevnar 13 and 23 valent Pneumovax in 1 year

## 2014-11-14 DIAGNOSIS — K006 Disturbances in tooth eruption: Secondary | ICD-10-CM | POA: Diagnosis not present

## 2014-11-21 ENCOUNTER — Telehealth: Payer: Self-pay | Admitting: Family Medicine

## 2014-11-21 ENCOUNTER — Other Ambulatory Visit: Payer: Self-pay

## 2014-11-21 DIAGNOSIS — Z1231 Encounter for screening mammogram for malignant neoplasm of breast: Secondary | ICD-10-CM

## 2014-11-21 DIAGNOSIS — M810 Age-related osteoporosis without current pathological fracture: Secondary | ICD-10-CM

## 2014-11-21 NOTE — Telephone Encounter (Signed)
Patient needs a referral for a Bone Density test.

## 2014-11-21 NOTE — Telephone Encounter (Signed)
Order has been entered

## 2014-11-30 ENCOUNTER — Encounter: Payer: Self-pay | Admitting: Family Medicine

## 2014-12-06 ENCOUNTER — Other Ambulatory Visit: Payer: Self-pay | Admitting: Family Medicine

## 2014-12-12 DIAGNOSIS — F3130 Bipolar disorder, current episode depressed, mild or moderate severity, unspecified: Secondary | ICD-10-CM | POA: Diagnosis not present

## 2014-12-14 ENCOUNTER — Telehealth: Payer: Self-pay | Admitting: Family Medicine

## 2014-12-14 DIAGNOSIS — Z1211 Encounter for screening for malignant neoplasm of colon: Secondary | ICD-10-CM

## 2014-12-14 NOTE — Telephone Encounter (Signed)
Tammy [name] called saying it's time for her to have a Colonoscopy. She doesn't have anyone in particular in mind but would like [place] to send a referral to a location we work with and call her back with the appointment day and time. Please give her a call if she needs to schedule an appointment here.  Pt ph# [phone] Thank you.

## 2014-12-15 ENCOUNTER — Ambulatory Visit
Admission: RE | Admit: 2014-12-15 | Discharge: 2014-12-15 | Disposition: A | Discharge status: Home / Self Care | Payer: Commercial Managed Care - HMO | Source: Ambulatory Visit

## 2014-12-15 DIAGNOSIS — Z1231 Encounter for screening mammogram for malignant neoplasm of breast: Secondary | ICD-10-CM

## 2014-12-15 NOTE — Telephone Encounter (Signed)
Order has been entered for referral.

## 2014-12-20 DIAGNOSIS — F3130 Bipolar disorder, current episode depressed, mild or moderate severity, unspecified: Secondary | ICD-10-CM | POA: Diagnosis not present

## 2014-12-30 ENCOUNTER — Telehealth: Payer: Self-pay | Admitting: Gastroenterology

## 2014-12-30 NOTE — Telephone Encounter (Signed)
Ok, please put the records on my desk for review.  Thanks

## 2015-01-02 NOTE — Telephone Encounter (Signed)
Dr. Ardis Hughs reviewed records and has accepted patient. Due for recall colon in 06/2015. Recall Letter has been entered and left message for patient to return my call.

## 2015-01-10 DIAGNOSIS — F3130 Bipolar disorder, current episode depressed, mild or moderate severity, unspecified: Secondary | ICD-10-CM | POA: Diagnosis not present

## 2015-01-31 ENCOUNTER — Other Ambulatory Visit: Payer: Self-pay | Admitting: Family Medicine

## 2015-02-06 DIAGNOSIS — F3130 Bipolar disorder, current episode depressed, mild or moderate severity, unspecified: Secondary | ICD-10-CM | POA: Diagnosis not present

## 2015-02-09 DIAGNOSIS — Z79899 Other long term (current) drug therapy: Secondary | ICD-10-CM | POA: Diagnosis not present

## 2015-03-30 DIAGNOSIS — Z0100 Encounter for examination of eyes and vision without abnormal findings: Secondary | ICD-10-CM | POA: Diagnosis not present

## 2015-05-09 ENCOUNTER — Encounter: Payer: Self-pay | Admitting: Gastroenterology

## 2015-05-17 ENCOUNTER — Encounter: Payer: Self-pay | Admitting: Gastroenterology

## 2015-07-03 ENCOUNTER — Other Ambulatory Visit: Payer: Self-pay | Admitting: Family Medicine

## 2015-07-03 DIAGNOSIS — F3130 Bipolar disorder, current episode depressed, mild or moderate severity, unspecified: Secondary | ICD-10-CM | POA: Diagnosis not present

## 2015-07-05 ENCOUNTER — Encounter: Payer: Self-pay | Admitting: Family Medicine

## 2015-07-05 ENCOUNTER — Ambulatory Visit (AMBULATORY_SURGERY_CENTER): Payer: Self-pay | Admitting: *Deleted

## 2015-07-05 VITALS — Ht 59.0 in | Wt 104.0 lb

## 2015-07-05 DIAGNOSIS — Z800 Family history of malignant neoplasm of digestive organs: Secondary | ICD-10-CM

## 2015-07-05 MED ORDER — NA SULFATE-K SULFATE-MG SULF 17.5-3.13-1.6 GM/177ML PO SOLN: ORAL | Status: DC

## 2015-07-05 NOTE — Progress Notes (Signed)
Patient denies any allergies to eggs or soy. Patient denies any problems with anesthesia/sedation. Patient denies any oxygen use at home and does not take any diet/weight loss medications.  

## 2015-07-06 ENCOUNTER — Encounter: Payer: Self-pay | Admitting: Gastroenterology

## 2015-07-06 NOTE — Telephone Encounter (Signed)
Looks like an order has already been placed. Please schedule for this pt. You can route back to me so I can make her aware unless you want to reply to her with the information. Thank you.

## 2015-07-12 ENCOUNTER — Other Ambulatory Visit: Payer: Self-pay | Admitting: Family Medicine

## 2015-07-19 ENCOUNTER — Ambulatory Visit (AMBULATORY_SURGERY_CENTER): Payer: Commercial Managed Care - HMO | Admitting: Gastroenterology

## 2015-07-19 ENCOUNTER — Encounter: Payer: Self-pay | Admitting: Gastroenterology

## 2015-07-19 VITALS — BP 107/54 | HR 70 | Temp 96.9°F | Resp 14 | Ht <= 58 in | Wt 107.0 lb

## 2015-07-19 DIAGNOSIS — F319 Bipolar disorder, unspecified: Secondary | ICD-10-CM | POA: Diagnosis not present

## 2015-07-19 DIAGNOSIS — E039 Hypothyroidism, unspecified: Secondary | ICD-10-CM | POA: Diagnosis not present

## 2015-07-19 DIAGNOSIS — E785 Hyperlipidemia, unspecified: Secondary | ICD-10-CM | POA: Diagnosis not present

## 2015-07-19 DIAGNOSIS — Z1211 Encounter for screening for malignant neoplasm of colon: Secondary | ICD-10-CM

## 2015-07-19 DIAGNOSIS — D120 Benign neoplasm of cecum: Secondary | ICD-10-CM | POA: Diagnosis not present

## 2015-07-19 DIAGNOSIS — F329 Major depressive disorder, single episode, unspecified: Secondary | ICD-10-CM | POA: Diagnosis not present

## 2015-07-19 DIAGNOSIS — Z800 Family history of malignant neoplasm of digestive organs: Secondary | ICD-10-CM | POA: Diagnosis not present

## 2015-07-19 MED ORDER — SODIUM CHLORIDE 0.9 % IV SOLN: 500.0000 mL | INTRAVENOUS | Status: DC

## 2015-07-19 NOTE — Progress Notes (Signed)
Report to PACU, RN, vss, BBS= Clear.  

## 2015-07-19 NOTE — Op Note (Signed)
[facility] Patient Name: Tammy [name] Procedure Date: 07/19/2015 9:47 AM MRN: [id number] Endoscopist: Milus Banister , MD Age: 65 Referring MD:  Date of Birth: February 23, 1950 Gender: Female Account #: [id number] Procedure:                Colonoscopy Indications:              Colon cancer screening in patient at increased                            risk: Family history of colorectal cancer in                            multiple 2nd degree relatives (three second degree                            relatives with colon cancer), her last screening                            was 2012 (no polyps) Medicines:                Monitored Anesthesia Care Procedure:                Pre-Anesthesia Assessment:                           - Prior to the procedure, a History and Physical                            was performed, and patient medications and                            allergies were reviewed. The patient's tolerance of                            previous anesthesia was also reviewed. The risks                            and benefits of the procedure and the sedation                            options and risks were discussed with the patient.                            All questions were answered, and informed consent                            was obtained. Prior Anticoagulants: The patient has                            taken no previous anticoagulant or antiplatelet                            agents. ASA Grade Assessment: II - A patient with  mild systemic disease. After reviewing the risks                            and benefits, the patient was deemed in                            satisfactory condition to undergo the procedure.                           After obtaining informed consent, the colonoscope                            was passed under direct vision. Throughout the                            procedure, the patient's blood pressure, pulse, and                             oxygen saturations were monitored continuously. The                            Model PCF-H190L [id number]) scope was introduced                            through the anus and advanced to the the cecum,                            identified by appendiceal orifice and ileocecal                            valve. The colonoscopy was performed without                            difficulty. The patient tolerated the procedure                            well. The quality of the bowel preparation was                            good. The ileocecal valve, appendiceal orifice, and                            rectum were photographed. Scope In: 9:52:36 AM Scope Out: 10:05:25 AM Scope Withdrawal Time: 0 hours 8 minutes 29 seconds  Total Procedure Duration: 0 hours 12 minutes 49 seconds  Findings:                 A 3 mm polyp was found in the cecum. The polyp was                            sessile. The polyp was removed with a cold snare.                            Resection and retrieval were complete.  The exam was otherwise without abnormality on                            direct and retroflexion views. Complications:            No immediate complications. Estimated blood loss:                            None. Estimated Blood Loss:     Estimated blood loss: none. Impression:               - One 3 mm polyp in the cecum, removed with a cold                            snare. Resected and retrieved.                           - The examination was otherwise normal on direct                            and retroflexion views. Recommendation:           - Patient has a contact number available for                            emergencies. The signs and symptoms of potential                            delayed complications were discussed with the                            patient. Return to normal activities tomorrow.                            Written  discharge instructions were provided to the                            patient.                           - Resume previous diet.                           - Continue present medications.                           You will receive a letter within 2-3 weeks with the                            pathology results and my final recommendations.                           If the polyp(s) is proven to be 'pre-cancerous' on                            pathology, you will need repeat colonoscopy in 5  years. Milus Banister, MD 07/19/2015 10:08:35 AM This report has been signed electronically.

## 2015-07-19 NOTE — Progress Notes (Signed)
Called to room to assist during endoscopic procedure.  Patient ID and intended procedure confirmed with present staff. Received instructions for my participation in the procedure from the performing physician.  

## 2015-07-19 NOTE — Progress Notes (Signed)
No problems noted in the recovery room. maw 

## 2015-07-19 NOTE — Patient Instructions (Signed)
YOU HAD AN ENDOSCOPIC PROCEDURE TODAY AT THE [facility]:   Refer to the procedure report that was given to you for any specific questions about what was found during the examination.  If the procedure report does not answer your questions, please call your gastroenterologist to clarify.  If you requested that your care partner not be given the details of your procedure findings, then the procedure report has been included in a sealed envelope for you to review at your convenience later.  YOU SHOULD EXPECT: Some feelings of bloating in the abdomen. Passage of more gas than usual.  Walking can help get rid of the air that was put into your GI tract during the procedure and reduce the bloating. If you had a lower endoscopy (such as a colonoscopy or flexible sigmoidoscopy) you may notice spotting of blood in your stool or on the toilet paper. If you underwent a bowel prep for your procedure, you may not have a normal bowel movement for a few days.  Please Note:  You might notice some irritation and congestion in your nose or some drainage.  This is from the oxygen used during your procedure.  There is no need for concern and it should clear up in a day or so.  SYMPTOMS TO REPORT IMMEDIATELY:   Following lower endoscopy (colonoscopy or flexible sigmoidoscopy):  Excessive amounts of blood in the stool  Significant tenderness or worsening of abdominal pains  Swelling of the abdomen that is new, acute  Fever of 100F or higher   For urgent or emergent issues, a gastroenterologist can be reached at any hour by calling [phone].   DIET: Your first meal following the procedure should be a small meal and then it is ok to progress to your normal diet. Heavy or fried foods are harder to digest and may make you feel nauseous or bloated.  Likewise, meals heavy in dairy and vegetables can increase bloating.  Drink plenty of fluids but you should avoid alcoholic beverages for 24  hours.  ACTIVITY:  You should plan to take it easy for the rest of today and you should NOT DRIVE or use heavy machinery until tomorrow (because of the sedation medicines used during the test).    FOLLOW UP: Our staff will call the number listed on your records the next business day following your procedure to check on you and address any questions or concerns that you may have regarding the information given to you following your procedure. If we do not reach you, we will leave a message.  However, if you are feeling well and you are not experiencing any problems, there is no need to return our call.  We will assume that you have returned to your regular daily activities without incident.  If any biopsies were taken you will be contacted by phone or by letter within the next 1-3 weeks.  Please call us at [phone] if you have not heard about the biopsies in 3 weeks.    SIGNATURES/CONFIDENTIALITY: You and/or your care partner have signed paperwork which will be entered into your electronic medical record.  These signatures attest to the fact that that the information above on your After Visit Summary has been reviewed and is understood.  Full responsibility of the confidentiality of this discharge information lies with you and/or your care-partner.     Handout was given to your care partner on polyps. You may resume your current medications today. Await biopsy results. Please call   if any questions or concerns.   

## 2015-07-20 ENCOUNTER — Telehealth: Payer: Self-pay

## 2015-07-20 NOTE — Telephone Encounter (Signed)
  Follow up Call-  Call back number 07/19/2015  Post procedure Call Back phone  # [phone]  Permission to leave phone message Yes     Patient was called for follow up after her procedure on 07/19/2015. No answer at the number given for follow up phone call. A message was left on the answering machine.

## 2015-07-25 ENCOUNTER — Encounter: Payer: Self-pay | Admitting: Gastroenterology

## 2015-07-26 ENCOUNTER — Ambulatory Visit (INDEPENDENT_AMBULATORY_CARE_PROVIDER_SITE_OTHER)
Admission: RE | Admit: 2015-07-26 | Discharge: 2015-07-26 | Disposition: A | Discharge status: Home / Self Care | Payer: Commercial Managed Care - HMO | Source: Ambulatory Visit | Attending: Family Medicine | Admitting: Family Medicine

## 2015-07-26 DIAGNOSIS — M810 Age-related osteoporosis without current pathological fracture: Secondary | ICD-10-CM

## 2015-07-31 ENCOUNTER — Telehealth (INDEPENDENT_AMBULATORY_CARE_PROVIDER_SITE_OTHER): Payer: Commercial Managed Care - HMO | Admitting: *Deleted

## 2015-07-31 ENCOUNTER — Other Ambulatory Visit (INDEPENDENT_AMBULATORY_CARE_PROVIDER_SITE_OTHER): Payer: Commercial Managed Care - HMO

## 2015-07-31 ENCOUNTER — Telehealth: Payer: Self-pay | Admitting: Family Medicine

## 2015-07-31 DIAGNOSIS — N390 Urinary tract infection, site not specified: Secondary | ICD-10-CM

## 2015-07-31 DIAGNOSIS — R309 Painful micturition, unspecified: Secondary | ICD-10-CM | POA: Diagnosis not present

## 2015-07-31 LAB — POC URINALSYSI DIPSTICK (AUTOMATED)
Bilirubin, UA: NEGATIVE
Glucose, UA: NEGATIVE
KETONES UA: NEGATIVE
Nitrite, UA: NEGATIVE
Spec Grav, UA: 1.015
Urobilinogen, UA: 0.2
pH, UA: 8.5

## 2015-07-31 LAB — POCT URINALYSIS DIPSTICK
Bilirubin, UA: NEGATIVE
GLUCOSE UA: NEGATIVE
KETONES UA: NEGATIVE
Nitrite, UA: NEGATIVE
SPEC GRAV UA: 1.015
UROBILINOGEN UA: 0.2
pH, UA: 8.5

## 2015-07-31 MED ORDER — SULFAMETHOXAZOLE-TRIMETHOPRIM 800-160 MG PO TABS: 1.0000 | ORAL_TABLET | Freq: Two times a day (BID) | ORAL | Status: DC

## 2015-07-31 NOTE — Telephone Encounter (Signed)
Error/njr °

## 2015-07-31 NOTE — Telephone Encounter (Signed)
Per Dr. Erick Blinks notes in lab results of UA, Septra DS ordered and sent to pharmacy.

## 2015-07-31 NOTE — Telephone Encounter (Signed)
Patient walked in today with complaints of "woke up with bladder infection yesterday." During triage, patient states 48 hours ago she started having symptoms of burning, painful urination and intermittent blood in urine. Patient confirmed history of bladder infection several years ago and Bactrim was prescribed. Vitals during triage: BP: 86/52 (provider aware of hypotension)  HR: 80 O2: 95% on RA Temp: 98.1.    Spoke with Dr. Elease Hashimoto and he advised to get a UA and Culture. Once UA results are back, he will provide instructions on if calling medication in. Told patient I will be in touch with her once instructions are provided.

## 2015-07-31 NOTE — Addendum Note (Signed)
Addended by: Precious Gilding on: 07/31/2015 03:43 PM   Modules accepted: Orders

## 2015-08-03 LAB — URINE CULTURE

## 2015-08-10 NOTE — Progress Notes (Addendum)
Subjective:   Tammy [name] is a 65 y.o. female who presents for Medicare Annual (Initial)  preventive examination.  Review of Systems:  HRA assessment completed during this visit with Tammy [name]   The Patient was informed that the wellness visit is to identify future health risk and educate and initiate measures that can reduce risk for increased disease through the lifespan.    Describes health: Osteoporosis is coming back Tendency to get depressed and angry but well managed now on meds Mother lives with her; and states she was treated poorly when young Moved mother from update [place] Dec 2015;   NO ROS; Medicare Wellness Visit Last OV:  10/2014 Labs completed: 2016; cohl 182; Trig 59; HDL 59 and LDL 110  Osteoporosis; will discuss bone loss and meds Hyperlipidemia / labs are good  Hx of colon cancer; MGF and others  Psychosocial: can't stop zoloft or Depakote; working well for her Son is 26 and lives with the Tammy. 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 Had periodontal disease;  Goes to the dentist now;   Exercise; has a treadmill at home for weight bearing Was walking 30 minutes 5 days a week; Not able to do because she is taking are of mother;    [place] with mother (39) and son One level  The bathroom is inside the Scientific laboratory technician issues reviewed for risk such as safe community; smoke Secondary school teacher; firearms safety if applicable; protection when in the sun; driving safety for seniors or any recent accidents. Fall hx; no  UA or BOWEL incontinence; once in a while; has bladder infections and one recent has resolved on antibiotic    Functional losses from last year to this year? no Given education on "Fall Prevention in the Home" for more safety tips the patient can apply as appropriate.   Risk for Depression reviewed:  Any emotional problems? Anxious, depressed, irritable, sad or blue? No is stable now but mother is a Psychologist, counselling Denies feeling depressed or hopeless; voices pleasure in daily life How many social activities have you been engaged in within the last 2 weeks? Taking care of mother and very demanding. Discussed plan to get out of the home for at least 4 hours in the afternoon to write and work on her book; this would be helpful to her Encouraged her to consider this her medicine   Sleep pattern /no c.o  Cognitive; states sometimes the medicine slows her recall but still takes care of all of her finances etc.  Manages checkbook, medications; no failures of task Ad8 score reviewed for issues;  Issues making decisions; no  Less interest in hobbies / activities" no  Repeats questions, stories; family complaining: NO  Trouble using ordinary gadgets; microwave; computer: no  Forgets the month or year: no  Mismanaging finances: no  Missing apt: no but does write them down  Daily problems with thinking of memory NO Ad8 score is 0   Advanced Directive addressed; Has completed   Counseling Health Maintenance Gaps: Hep C; will have at next blood draw  Colonoscopy; 07/2020 (repeated 5 years )  EKG: deferred  Mammogram:  12/2015 due Dexa/ 07/2015 completed (-2.7)  Will see the doctor next week to discuss treatment;  Introduced prolia; wants med that will help correct the problem Referred to the Osteoporosis [employer] for more information   Hearing: 2000 hz both ears  Ophthalmology exam scheduled at the end of August   Immunizations Due: (Vaccines reviewed and educated regarding any overdue)   Established and updated Risk reviewed and appropriate referral made or health  recommendations:  Current Care Team reviewed and updated  Cardiac Risk Factors include: advanced age (>59men, >88 women);dyslipidemia;sedentary lifestyle     Objective:     Vitals: BP (!) 86/60   Pulse (P) 87   Ht (P) 4\' 10"  (1.473 m)   Wt (P) 105 lb 8 oz (47.9 kg)   SpO2 (P) 97%   BMI (P) 22.05 kg/m   Body mass index is 22.05 kg/m (pended).   Tobacco History  Smoking Status  . Never Smoker  Smokeless Tobacco  . Never Used     Counseling given: Yes   Past Medical History:  Diagnosis Date  . Allergic rhinitis   . Anxiety   . Depression   . History of colonic polyps   . Hyperlipemia   . Hypothyroidism   . Osteoporosis    Past Surgical History:  Procedure Laterality Date  . APPENDECTOMY    . CESAREAN SECTION  1988  . COLONOSCOPY  1998,2002,2007,2012  . LOBECTOMY  1989  . POLYPECTOMY  1998 only   hyperplastic   Family History  Problem Relation Age of Onset  . Gout Father   . Hyperlipidemia Father   . Osteoporosis Mother   . Hypothyroidism Mother   . Hyperlipidemia    . Colon cancer Paternal Uncle   . Colon cancer Maternal Grandfather   . Colon cancer Cousin    History  Sexual Activity  . Sexual activity: Not on file    Outpatient Encounter Prescriptions as of 08/11/2015  Medication Sig  . aspirin 81 MG tablet Take 81 mg by mouth daily.    . calcium-vitamin D (OSCAL) 250-125 MG-UNIT per tablet Take 1 tablet by mouth daily.  . divalproex (DEPAKOTE ER) 500 MG 24 hr tablet Take 750 mg by mouth daily.  Marland Kitchen levothyroxine (SYNTHROID, LEVOTHROID) 75 MCG tablet TAKE 1 TABLET EVERY DAY  . Multiple Vitamins-Minerals (WOMENS 50+ MULTI VITAMIN/MIN PO) Take by mouth daily.  . sertraline (ZOLOFT) 100 MG tablet Take 200 mg by mouth daily. Take 1 1/2 tablet bu mouth daily.  . simvastatin (ZOCOR) 20 MG tablet TAKE 1 TABLET AT BEDTIME  . sulfamethoxazole-trimethoprim (BACTRIM DS,SEPTRA DS) 800-160 MG tablet Take 1 tablet by mouth 2 (two) times daily. For 5 days  (Patient not taking: Reported on 08/11/2015)   No facility-administered encounter medications on file as of 08/11/2015.     Activities of Daily Living In your present state of health, do you have any difficulty performing the following activities: 08/11/2015  Hearing? N  Vision? N  Difficulty concentrating or making decisions? N  Walking or climbing stairs? N  Dressing or bathing? N  Doing errands, shopping? N  Preparing Food and eating ? N  Using the Toilet? N  In the past six months, have you accidently leaked urine? N  Do you have problems with loss of bowel control? N  Managing your Medications? N  Managing your Finances? N  Housekeeping or managing your Housekeeping? N  Some recent data might be hidden    Patient Care Team: Eulas Post, MD as  PCP - General (Family Medicine)    Assessment:      Risk for CV disease reviewed; including BP; Cholesterol; BS if appropriate; diet and exercise; (discussed continued exercise; decreasing stress; Diabetes Screening /asked about pre-diabetes and education provided  Osteopenia; discussed strength bearing exercise and can fup with the Osteoporosis Foundation   Barriers to Success Stress of caring for mother;    Exercise Activities and Dietary recommendations Current Exercise Habits: Home exercise routine, Time (Minutes): 30, Frequency (Times/Week): 3, Weekly Exercise (Minutes/Week): 90, Intensity: Mild  Goals    . patient          [place]; from [place] to [place]  08/11/2015 11/09/2014 05/05/2013  Falls in the past year? No No No   Depression Screen PHQ 2/9 Scores 08/11/2015 11/09/2014 05/05/2013  PHQ - 2 Score 0 1 0     Cognitive Testing MMSE - Mini Mental State Exam 08/11/2015  Not completed: (No Data)    Immunization History  Administered Date(s) Administered  . Influenza Split 10/30/2013  . Pneumococcal Conjugate-13 11/09/2014  . Td 07/14/2007  .  Zoster 04/06/2014   Screening Tests Health Maintenance  Topic Date Due  . Hepatitis C Screening  [birth year]  . INFLUENZA VACCINE  08/15/2015  . PNA vac Low Risk Adult (2 of 2 - PPSV23) 11/09/2015  . MAMMOGRAM  12/14/2016  . TETANUS/TDAP  07/13/2017  . COLONOSCOPY  07/18/2020  . DEXA SCAN  Completed  . ZOSTAVAX  Completed      Plan:  Eye exam scheduled end of August Dr. Nicki Reaper  Keep walking on the treadmill!   We did not get to discuss the Hepatitis C so I am sending you information. Medicare has offered a free screening for Medicare patient born between [birth year] and [birth year] due to Hep C not being identified and people could have been explosed through non- traditional ways, as ear piercing etc. There is a pill now that cures Hepatitis C; You can have at your next fup for blood draw.   I gave you some information on osteoporosis, but feel free to visit the Osteoporosis Foundation for more information!  During the course of the visit the patient was educated and counseled about the following appropriate screening and preventive services:   Vaccines to include Pneumoccal, Influenza, Hepatitis B, Td, Zostavax, HCV  Electrocardiogram  Cardiovascular Disease  Colorectal cancer screening  Bone density screening  Diabetes screening  Glaucoma screening  Mammography/PAP  Nutrition counseling   Patient Instructions (the written plan) was given to the patient.   Wynetta Fines, RN  08/11/2015  Agree with assessment above of Wynetta Fines.  Will discuss with patient at next visit Hep C screening.  Eulas Post MD [facility]

## 2015-08-11 ENCOUNTER — Ambulatory Visit (INDEPENDENT_AMBULATORY_CARE_PROVIDER_SITE_OTHER): Payer: Commercial Managed Care - HMO

## 2015-08-11 VITALS — BP 86/60

## 2015-08-11 DIAGNOSIS — Z0000 Encounter for general adult medical examination without abnormal findings: Secondary | ICD-10-CM

## 2015-08-11 DIAGNOSIS — Z7289 Other problems related to lifestyle: Secondary | ICD-10-CM | POA: Diagnosis not present

## 2015-08-11 NOTE — Patient Instructions (Addendum)
Tammy [name] , Thank you for taking time to come for your Medicare Wellness Visit. I appreciate your ongoing commitment to your health goals. Please review the following plan we discussed and let me know if I can assist you in the future.   Eye exam scheduled end of August Dr. Nicki Reaper  Keep walking on the treadmill!   We did not get to discuss the Hepatitis C so I am sending you information. Medicare has offered a free screening for Medicare patient born between [birth year] and 24 due to Hep C not being identified and people could have been explosed through non- traditional ways, as ear piercing etc. There is a pill now that cures Hepatitis C; You can have at your next fup for blood draw.   I gave you some information on osteoporosis, but feel free to visit the [employer] for more information!  These are the goals we discussed: Goals    . patient          [place] and book; from 1 to 4pm        This is a list of the screening recommended for you and due dates:  Health Maintenance  Topic Date Due  .  Hepatitis C: One time screening is recommended by Center for Disease Control  (CDC) for  adults born from [birth year] through 1967.   1950/05/12  . Flu Shot  08/15/2015  . Pneumonia vaccines (2 of 2 - PPSV23) 11/09/2015  . Mammogram  12/14/2016  . Tetanus Vaccine  07/13/2017  . Colon Cancer Screening  07/18/2020  . DEXA scan (bone density measurement)  Completed  . Shingles Vaccine  Completed    Osteoporosis Osteoporosis is the thinning and loss of density in the bones. Osteoporosis makes the bones more brittle, fragile, and likely to break (fracture). Over time, osteoporosis can cause the bones to become so weak that they fracture after a simple fall. The bones most likely to fracture are the bones in the hip, wrist, and spine. CAUSES  The exact cause is not known. RISK FACTORS Anyone can develop osteoporosis. You may be at greater risk if you have a  family history of the condition or have poor nutrition. You may also have a higher risk if you are:   Female.   105 years old or older.  A smoker.  Not physically active.   White or Asian.  Slender. SIGNS AND SYMPTOMS  A fracture might be the first sign of the disease, especially if it results from a fall or injury that would not usually cause a bone to break. Other signs and symptoms include:   Low back and neck pain.  Stooped posture.  Height loss. DIAGNOSIS  To make a diagnosis, your health care provider may:  Take a medical history.  Perform a physical exam.  Order tests, such as:  A bone mineral density test.  A dual-energy X-ray absorptiometry test. TREATMENT  The goal of osteoporosis treatment is to strengthen your bones to reduce your risk of a fracture. Treatment may involve:  Making lifestyle changes, such as:  Eating a diet rich in calcium.  Doing weight-bearing and muscle-strengthening exercises.  Stopping tobacco use.  Limiting alcohol intake.  Taking medicine to slow the process of bone loss or to increase bone density.  Monitoring your levels of calcium and vitamin D. HOME CARE INSTRUCTIONS  Include calcium and vitamin D in your diet. Calcium is important for bone health, and vitamin  D helps the body absorb calcium.  Perform weight-bearing and muscle-strengthening exercises as directed by your health care provider.  Do not use any tobacco products, including cigarettes, chewing tobacco, and electronic cigarettes. If you need help quitting, ask your health care provider.  Limit your alcohol intake.  Take medicines only as directed by your health care provider.  Keep all follow-up visits as directed by your health care provider. This is important.  Take precautions at home to lower your risk of falling, such as:  Keeping rooms well lit and clutter free.  Installing safety rails on stairs.  Using rubber mats in the bathroom and other  areas that are often wet or slippery. SEEK IMMEDIATE MEDICAL CARE IF:  You fall or injure yourself.    This information is not intended to replace advice given to you by your health care provider. Make sure you discuss any questions you have with your health care provider.   Document Released: 10/10/2004 Document Revised: 01/21/2014 Document Reviewed: 06/10/2013 Elsevier Interactive Patient Education 2016 [employer]   Fat and Cholesterol Restricted Diet Getting too much fat and cholesterol in your diet may cause health problems. Following this diet helps keep your fat and cholesterol at normal levels. This can keep you from getting sick. WHAT TYPES OF FAT SHOULD I CHOOSE?  Choose monosaturated and polyunsaturated fats. These are found in foods such as olive oil, canola oil, flaxseeds, walnuts, almonds, and seeds.  Eat more omega-3 fats. Good choices include salmon, mackerel, sardines, tuna, flaxseed oil, and ground flaxseeds.  Limit saturated fats. These are in animal products such as meats, butter, and cream. They can also be in plant products such as palm oil, palm kernel oil, and coconut oil.   Avoid foods with partially hydrogenated oils in them. These contain trans fats. Examples of foods that have trans fats are stick margarine, some tub margarines, cookies, crackers, and other baked goods. WHAT GENERAL GUIDELINES DO I NEED TO FOLLOW?   Check food labels. Look for the words "trans fat" and "saturated fat."  When preparing a meal:  Fill half of your plate with vegetables and green salads.  Fill one fourth of your plate with whole grains. Look for the word "whole" as the first word in the ingredient list.  Fill one fourth of your plate with lean protein foods.  Limit fruit to two servings a day. Choose fruit instead of juice.  Eat more foods with soluble fiber. Examples of foods with this type of fiber are apples, broccoli, carrots, beans, peas, and barley. Try to get 20-30  g (grams) of fiber per day.  Eat more home-cooked foods. Eat less at restaurants and buffets.  Limit or avoid alcohol.  Limit foods high in starch and sugar.  Limit fried foods.  Cook foods without frying them. Baking, boiling, grilling, and broiling are all great options.  Lose weight if you are overweight. Losing even a small amount of weight can help your overall health. It can also help prevent diseases such as diabetes and heart disease. WHAT FOODS CAN I EAT? Grains Whole grains, such as whole wheat or whole grain breads, crackers, cereals, and pasta. Unsweetened oatmeal, bulgur, barley, quinoa, or brown rice. Corn or whole wheat flour tortillas. Vegetables Fresh or frozen vegetables (raw, steamed, roasted, or grilled). Green salads. Fruits All fresh, canned (in natural juice), or frozen fruits. Meat and Other Protein Products Ground beef (85% or leaner), grass-fed beef, or beef trimmed of fat. Skinless chicken or [place]. Ground  chicken or [place]. Pork trimmed of fat. All fish and seafood. Eggs. Dried beans, peas, or lentils. Unsalted nuts or seeds. Unsalted canned or dry beans. Dairy Low-fat dairy products, such as skim or 1% milk, 2% or reduced-fat cheeses, low-fat ricotta or cottage cheese, or plain low-fat yogurt. Fats and Oils Tub margarines without trans fats. Light or reduced-fat mayonnaise and salad dressings. Avocado. Olive, canola, sesame, or safflower oils. Natural peanut or almond butter (choose ones without added sugar and oil). The items listed above may not be a complete list of recommended foods or beverages. Contact your dietitian for more options. WHAT FOODS ARE NOT RECOMMENDED? Grains White bread. White pasta. White rice. Cornbread. Bagels, pastries, and croissants. Crackers that contain trans fat. Vegetables White potatoes. Corn. Creamed or fried vegetables. Vegetables in a cheese sauce. Fruits Dried fruits. Canned fruit in light or heavy syrup. Fruit  juice. Meat and Other Protein Products Fatty cuts of meat. Ribs, chicken wings, bacon, sausage, bologna, salami, chitterlings, fatback, hot dogs, bratwurst, and packaged luncheon meats. Liver and organ meats. Dairy Whole or 2% milk, cream, half-and-half, and cream cheese. Whole milk cheeses. Whole-fat or sweetened yogurt. Full-fat cheeses. Nondairy creamers and whipped toppings. Processed cheese, cheese spreads, or cheese curds. Sweets and Desserts Corn syrup, sugars, honey, and molasses. Candy. Jam and jelly. Syrup. Sweetened cereals. Cookies, pies, cakes, donuts, muffins, and ice cream. Fats and Oils Butter, stick margarine, lard, shortening, ghee, or bacon fat. Coconut, palm kernel, or palm oils. Beverages Alcohol. Sweetened drinks (such as sodas, lemonade, and fruit drinks or punches). The items listed above may not be a complete list of foods and beverages to avoid. Contact your dietitian for more information.   This information is not intended to replace advice given to you by your health care provider. Make sure you discuss any questions you have with your health care provider.   Document Released: 07/02/2011 Document Revised: 01/21/2014 Document Reviewed: 04/01/2013 Elsevier Interactive Patient Education 2016 [place] Maintenance, Female Adopting a healthy lifestyle and getting preventive care can go a long way to promote health and wellness. Talk with your health care provider about what schedule of regular examinations is right for you. This is a good chance for you to check in with your provider about disease prevention and staying healthy. In between checkups, there are plenty of things you can do on your own. Experts have done a lot of research about which lifestyle changes and preventive measures are most likely to keep you healthy. Ask your health care provider for more information. WEIGHT AND DIET  Eat a healthy diet  Be sure to include plenty of vegetables,  fruits, low-fat dairy products, and lean protein.  Do not eat a lot of foods high in solid fats, added sugars, or salt.  Get regular exercise. This is one of the most important things you can do for your health.  Most adults should exercise for at least 150 minutes each week. The exercise should increase your heart rate and make you sweat (moderate-intensity exercise).  Most adults should also do strengthening exercises at least twice a week. This is in addition to the moderate-intensity exercise.  Maintain a healthy weight  Body mass index (BMI) is a measurement that can be used to identify possible weight problems. It estimates body fat based on height and weight. Your health care provider can help determine your BMI and help you achieve or maintain a healthy weight.  For females 10 years of age and  older:   A BMI below 18.5 is considered underweight.  A BMI of 18.5 to 24.9 is normal.  A BMI of 25 to 29.9 is considered overweight.  A BMI of 30 and above is considered obese.  Watch levels of cholesterol and blood lipids  You should start having your blood tested for lipids and cholesterol at 67 years of age, then have this test every 5 years.  You may need to have your cholesterol levels checked more often if:  Your lipid or cholesterol levels are high.  You are older than 67 years of age.  You are at high risk for heart disease.  CANCER SCREENING   Lung Cancer  Lung cancer screening is recommended for adults 66-10 years old who are at high risk for lung cancer because of a history of smoking.  A yearly low-dose CT scan of the lungs is recommended for people who:  Currently smoke.  Have quit within the past 15 years.  Have at least a 30-pack-year history of smoking. A pack year is smoking an average of one pack of cigarettes a day for 1 year.  Yearly screening should continue until it has been 15 years since you quit.  Yearly screening should stop if you develop  a health problem that would prevent you from having lung cancer treatment.  Breast Cancer  Practice breast self-awareness. This means understanding how your breasts normally appear and feel.  It also means doing regular breast self-exams. Let your health care provider know about any changes, no matter how small.  If you are in your 20s or 30s, you should have a clinical breast exam (CBE) by a health care provider every 1-3 years as part of a regular health exam.  If you are 60 or older, have a CBE every year. Also consider having a breast X-ray (mammogram) every year.  If you have a family history of breast cancer, talk to your health care provider about genetic screening.  If you are at high risk for breast cancer, talk to your health care provider about having an MRI and a mammogram every year.  Breast cancer gene (BRCA) assessment is recommended for women who have family members with BRCA-related cancers. BRCA-related cancers include:  Breast.  Ovarian.  Tubal.  Peritoneal cancers.  Results of the assessment will determine the need for genetic counseling and BRCA1 and BRCA2 testing. Cervical Cancer Your health care provider may recommend that you be screened regularly for cancer of the pelvic organs (ovaries, uterus, and vagina). This screening involves a pelvic examination, including checking for microscopic changes to the surface of your cervix (Pap test). You may be encouraged to have this screening done every 3 years, beginning at age 68.  For women ages 30-65, health care providers may recommend pelvic exams and Pap testing every 3 years, or they may recommend the Pap and pelvic exam, combined with testing for human papilloma virus (HPV), every 5 years. Some types of HPV increase your risk of cervical cancer. Testing for HPV may also be done on women of any age with unclear Pap test results.  Other health care providers may not recommend any screening for nonpregnant women who  are considered low risk for pelvic cancer and who do not have symptoms. Ask your health care provider if a screening pelvic exam is right for you.  If you have had past treatment for cervical cancer or a condition that could lead to cancer, you need Pap tests and screening for cancer  for at least 20 years after your treatment. If Pap tests have been discontinued, your risk factors (such as having a new sexual partner) need to be reassessed to determine if screening should resume. Some women have medical problems that increase the chance of getting cervical cancer. In these cases, your health care provider may recommend more frequent screening and Pap tests. Colorectal Cancer  This type of cancer can be detected and often prevented.  Routine colorectal cancer screening usually begins at 67 years of age and continues through 67 years of age.  Your health care provider may recommend screening at an earlier age if you have risk factors for colon cancer.  Your health care provider may also recommend using home test kits to check for hidden blood in the stool.  A small camera at the end of a tube can be used to examine your colon directly (sigmoidoscopy or colonoscopy). This is done to check for the earliest forms of colorectal cancer.  Routine screening usually begins at age 41.  Direct examination of the colon should be repeated every 5-10 years through 67 years of age. However, you may need to be screened more often if early forms of precancerous polyps or small growths are found. Skin Cancer  Check your skin from head to toe regularly.  Tell your health care provider about any new moles or changes in moles, especially if there is a change in a mole's shape or color.  Also tell your health care provider if you have a mole that is larger than the size of a pencil eraser.  Always use sunscreen. Apply sunscreen liberally and repeatedly throughout the day.  Protect yourself by wearing long  sleeves, pants, a wide-brimmed hat, and sunglasses whenever you are outside. HEART DISEASE, DIABETES, AND HIGH BLOOD PRESSURE   High blood pressure causes heart disease and increases the risk of stroke. High blood pressure is more likely to develop in:  People who have blood pressure in the high end of the normal range (130-139/85-89 mm Hg).  People who are overweight or obese.  People who are African American.  If you are 24-15 years of age, have your blood pressure checked every 3-5 years. If you are 52 years of age or older, have your blood pressure checked every year. You should have your blood pressure measured twice--once when you are at a hospital or clinic, and once when you are not at a hospital or clinic. Record the average of the two measurements. To check your blood pressure when you are not at a hospital or clinic, you can use:  An automated blood pressure machine at a pharmacy.  A home blood pressure monitor.  If you are between 103 years and 7 years old, ask your health care provider if you should take aspirin to prevent strokes.  Have regular diabetes screenings. This involves taking a blood sample to check your fasting blood sugar level.  If you are at a normal weight and have a low risk for diabetes, have this test once every three years after 67 years of age.  If you are overweight and have a high risk for diabetes, consider being tested at a younger age or more often. PREVENTING INFECTION  Hepatitis B  If you have a higher risk for hepatitis B, you should be screened for this virus. You are considered at high risk for hepatitis B if:  You were born in a country where hepatitis B is common. Ask your health care provider which  countries are considered high risk.  Your parents were born in a high-risk country, and you have not been immunized against hepatitis B (hepatitis B vaccine).  You have HIV or AIDS.  You use needles to inject street drugs.  You live with  someone who has hepatitis B.  You have had sex with someone who has hepatitis B.  You get hemodialysis treatment.  You take certain medicines for conditions, including cancer, organ transplantation, and autoimmune conditions. Hepatitis C  Blood testing is recommended for:  Everyone born from [birth year] through 1967.  Anyone with known risk factors for hepatitis C. Sexually transmitted infections (STIs)  You should be screened for sexually transmitted infections (STIs) including gonorrhea and chlamydia if:  You are sexually active and are younger than 67 years of age.  You are older than 67 years of age and your health care provider tells you that you are at risk for this type of infection.  Your sexual activity has changed since you were last screened and you are at an increased risk for chlamydia or gonorrhea. Ask your health care provider if you are at risk.  If you do not have HIV, but are at risk, it may be recommended that you take a prescription medicine daily to prevent HIV infection. This is called pre-exposure prophylaxis (PrEP). You are considered at risk if:  You are sexually active and do not regularly use condoms or know the HIV status of your partner(s).  You take drugs by injection.  You are sexually active with a partner who has HIV. Talk with your health care provider about whether you are at high risk of being infected with HIV. If you choose to begin PrEP, you should first be tested for HIV. You should then be tested every 3 months for as long as you are taking PrEP.  PREGNANCY   If you are premenopausal and you may become pregnant, ask your health care provider about preconception counseling.  If you may become pregnant, take 400 to 800 micrograms (mcg) of folic acid every day.  If you want to prevent pregnancy, talk to your health care provider about birth control (contraception). OSTEOPOROSIS AND MENOPAUSE   Osteoporosis is a disease in which the bones lose  minerals and strength with aging. This can result in serious bone fractures. Your risk for osteoporosis can be identified using a bone density scan.  If you are 76 years of age or older, or if you are at risk for osteoporosis and fractures, ask your health care provider if you should be screened.  Ask your health care provider whether you should take a calcium or vitamin D supplement to lower your risk for osteoporosis.  Menopause may have certain physical symptoms and risks.  Hormone replacement therapy may reduce some of these symptoms and risks. Talk to your health care provider about whether hormone replacement therapy is right for you.  HOME CARE INSTRUCTIONS   Schedule regular health, dental, and eye exams.  Stay current with your immunizations.   Do not use any tobacco products including cigarettes, chewing tobacco, or electronic cigarettes.  If you are pregnant, do not drink alcohol.  If you are breastfeeding, limit how much and how often you drink alcohol.  Limit alcohol intake to no more than 1 drink per day for nonpregnant women. One drink equals 12 ounces of beer, 5 ounces of wine, or 1 ounces of hard liquor.  Do not use street drugs.  Do not share needles.  Ask your  health care provider for help if you need support or information about quitting drugs.  Tell your health care provider if you often feel depressed.  Tell your health care provider if you have ever been abused or do not feel safe at home.   This information is not intended to replace advice given to you by your health care provider. Make sure you discuss any questions you have with your health care provider.   Document Released: 07/16/2010 Document Revised: 01/21/2014 Document Reviewed: 12/02/2012 Elsevier Interactive Patient Education [employer].

## 2015-08-28 DIAGNOSIS — F3130 Bipolar disorder, current episode depressed, mild or moderate severity, unspecified: Secondary | ICD-10-CM | POA: Diagnosis not present

## 2015-09-07 ENCOUNTER — Other Ambulatory Visit: Payer: Self-pay | Admitting: Family Medicine

## 2015-09-07 NOTE — Telephone Encounter (Signed)
Pt request refill  sulfamethoxazole-trimethoprim (BACTRIM DS,SEPTRA DS) 800-160 MG tablet  Pt seen 7/17 with UTI symptoms, and now they have returned. Pt declined appointment, in hopes Dr Elease Hashimoto will call in a new rx.  Pt is having pressure, urgency, and pain with urination. Same as before.

## 2015-09-08 NOTE — Telephone Encounter (Signed)
Dr. Elease Hashimoto, please see message regarding antibiotic.

## 2015-09-09 NOTE — Telephone Encounter (Signed)
CB: [phone]   Pt called in during Saturday clinic inquiring about her Rx for Bactrim. Pt is requesting a call back from office.     Thanks.

## 2015-09-09 NOTE — Telephone Encounter (Signed)
If pt having recurrent UTI symptoms (treated back in July) she needs to be seen.

## 2015-09-11 ENCOUNTER — Telehealth: Payer: Self-pay | Admitting: Emergency Medicine

## 2015-09-11 ENCOUNTER — Other Ambulatory Visit: Payer: Self-pay | Admitting: Family Medicine

## 2015-09-11 NOTE — Telephone Encounter (Signed)
Spoke with pt. Pt informed me that she is feeling better and will call and schedule an appointment if needed.

## 2015-09-11 NOTE — Telephone Encounter (Signed)
Please Advise Called and left message on pt's vm to call office . Triage follow up. No appointment was scheduled.        PLEASE NOTE: All timestamps contained within this report are represented as [place] Standard Time. CONFIDENTIALTY NOTICE: This fax transmission is intended only for the addressee. It contains information that is legally privileged, confidential or otherwise protected from use or disclosure. If you are not the intended recipient, you are strictly prohibited from reviewing, disclosing, copying using or disseminating any of this information or taking any action in reliance on or regarding this information. If you have received this fax in error, please notify us immediately by telephone so that we can arrange for its return to [place]. Phone: [phone], Toll-Free: [phone], Fax: [phone] Page: 1 of 2 Call Id: [id number] [facility] - Client [place] Patient Name: Tammy [name] Gender: Female DOB: January 01, 1951 Age: 65 Y 10 M 8 D Return Phone Number: [phone] (Primary) Address: City/State/Zip: [place] Client [facility] - Client Client Site [facility] - Night Physician Carolann Littler - MD Contact Type Call Who Is Calling Patient / Member / Family / Caregiver Call Type Triage / Clinical Caller Name Tammy [name] Relationship To Patient Self Return Phone Number [phone] (Primary) Chief Complaint Urine, Blood In Reason for Call Symptomatic / Request for [place] states last night she called Dr. Erick Blinks office, spoke to a lady, explained that her bladder infection has recurred (1st time 07/17). She is requesting refill. The Rx is not at [facility] [phone] open until 9 (24 hour [facility] [phone]). PreDisposition InappropriateToAsk Translation No Nurse Assessment Nurse: Richardson Landry, RN, Aldona Bar Date/Time (Eastern  Time): 09/08/2015 8:52:56 PM Confirm and document reason for call. If symptomatic, describe symptoms. You must click the next button to save text entered. ---Caller states the lady last night told pt they would do it tomorrow and she didn't need to call to make sure today. [facility] and they did not receive a refill for the medication. Caller states she is having burning and feeling of need to go constant, bleeding and pain. Urine is pink with little pieces of bright red tissue. Sx began 3 days ago, Caller denies fever. Has the patient traveled out of the country within the last 30 days? ---No Does the patient have any new or worsening symptoms? ---Yes Will a triage be completed? ---Yes Related visit to physician within the last 2 weeks? ---No Does the PT have any chronic conditions? (i.e. diabetes, asthma, etc.) ---Yes List chronic conditions. ---pre diabetic Is this a behavioral health or substance abuse call? ---No PLEASE NOTE: All timestamps contained within this report are represented as [place] Standard Time. CONFIDENTIALTY NOTICE: This fax transmission is intended only for the addressee. It contains information that is legally privileged, confidential or otherwise protected from use or disclosure. If you are not the intended recipient, you are strictly prohibited from reviewing, disclosing, copying using or disseminating any of this information or taking any action in reliance on or regarding this information. If you have received this fax in error, please notify us immediately by telephone so that we can arrange for its return to [place]. Phone: [phone], Toll-Free: [phone], Fax: [phone] Page: 2 of 2 Call Id: [id number] Guidelines Guideline Title Affirmed Question Affirmed Notes Nurse Date/Time Eilene Ghazi Time) Urine - Blood In Pain or burning with passing urine Richardson Landry, RN, Aldona Bar 09/08/2015 8:56:23 PM Disp. Time Eilene Ghazi Time)  Disposition Final  User 09/08/2015 9:00:50 PM See Physician within 24 Hours Yes Richardson Landry, RN, Aldona Bar Caller Understands: Yes Disagree/Comply: Comply Care Advice Given Per Guideline SEE PHYSICIAN WITHIN 24 HOURS: CALL BACK IF: * Fever occurs * You become worse. CARE ADVICE given per Urine, Blood In (Adult) guideline. Referrals REFERRED TO PCP OFFICE

## 2015-09-11 NOTE — Telephone Encounter (Signed)
Rx refill sent to pharmacy. 

## 2015-09-13 DIAGNOSIS — H5210 Myopia, unspecified eye: Secondary | ICD-10-CM | POA: Diagnosis not present

## 2015-10-19 ENCOUNTER — Telehealth: Payer: Self-pay | Admitting: Family Medicine

## 2015-10-19 NOTE — Telephone Encounter (Signed)
Pt would like to have hep c screening. Can I sch?

## 2015-10-19 NOTE — Telephone Encounter (Signed)
yes

## 2015-10-20 NOTE — Telephone Encounter (Signed)
Pt has been

## 2015-10-23 ENCOUNTER — Other Ambulatory Visit (INDEPENDENT_AMBULATORY_CARE_PROVIDER_SITE_OTHER): Payer: Commercial Managed Care - HMO

## 2015-10-23 DIAGNOSIS — Z7289 Other problems related to lifestyle: Secondary | ICD-10-CM | POA: Diagnosis not present

## 2015-10-24 LAB — HEPATITIS C ANTIBODY: HCV AB: NEGATIVE

## 2015-11-03 ENCOUNTER — Encounter: Payer: Self-pay | Admitting: Family Medicine

## 2015-11-03 ENCOUNTER — Ambulatory Visit (INDEPENDENT_AMBULATORY_CARE_PROVIDER_SITE_OTHER): Payer: Commercial Managed Care - HMO | Admitting: Family Medicine

## 2015-11-03 VITALS — BP 100/68 | HR 72 | Temp 98.0°F | Ht <= 58 in | Wt 98.3 lb

## 2015-11-03 DIAGNOSIS — E039 Hypothyroidism, unspecified: Secondary | ICD-10-CM | POA: Diagnosis not present

## 2015-11-03 DIAGNOSIS — E785 Hyperlipidemia, unspecified: Secondary | ICD-10-CM | POA: Diagnosis not present

## 2015-11-03 DIAGNOSIS — M810 Age-related osteoporosis without current pathological fracture: Secondary | ICD-10-CM | POA: Diagnosis not present

## 2015-11-03 LAB — BASIC METABOLIC PANEL
BUN: 20 mg/dL (ref 6–23)
CALCIUM: 9.7 mg/dL (ref 8.4–10.5)
CO2: 33 meq/L — AB (ref 19–32)
Chloride: 102 mEq/L (ref 96–112)
Creatinine, Ser: 0.76 mg/dL (ref 0.40–1.20)
GFR: 80.68 mL/min (ref >60.00)
GLUCOSE: 84 mg/dL (ref 70–99)
POTASSIUM: 4 meq/L (ref 3.5–5.1)
Sodium: 141 mEq/L (ref 135–145)

## 2015-11-03 LAB — HEPATIC FUNCTION PANEL
ALT: 23 U/L (ref 0–35)
AST: 32 U/L (ref 0–37)
Albumin: 4.6 g/dL (ref 3.5–5.2)
Alkaline Phosphatase: 60 U/L (ref 39–117)
BILIRUBIN TOTAL: 0.6 mg/dL (ref 0.2–1.2)
Bilirubin, Direct: 0.1 mg/dL (ref 0.0–0.3)
Total Protein: 7.5 g/dL (ref 6.0–8.3)

## 2015-11-03 LAB — LIPID PANEL
CHOL/HDL RATIO: 3
Cholesterol: 209 mg/dL — ABNORMAL HIGH (ref 0–200)
HDL: 64.6 mg/dL (ref >39.00)
LDL Cholesterol: 126 mg/dL — ABNORMAL HIGH (ref 0–99)
NONHDL: 144.12
Triglycerides: 92 mg/dL (ref 0.0–149.0)
VLDL: 18.4 mg/dL (ref 0.0–40.0)

## 2015-11-03 LAB — TSH: TSH: 3.01 u[IU]/mL (ref 0.35–4.50)

## 2015-11-03 MED ORDER — ALENDRONATE SODIUM 70 MG PO TABS: 70.0000 mg | ORAL_TABLET | ORAL | 3 refills | Status: DC

## 2015-11-03 NOTE — Progress Notes (Signed)
Pre visit review using our clinic review tool, if applicable. No additional management support is needed unless otherwise documented below in the visit note. 

## 2015-11-03 NOTE — Progress Notes (Signed)
Subjective:     Patient ID: Tammy [name], female   DOB: 1950-11-01, 65 y.o.   MRN: [id number]  HPI Patient here to discuss multiple items. Her chronic problems include history of bipolar disorder, hypothyroidism, hyperlipidemia, history depression, osteoporosis. No recent falls or fracture.  Osteoporosis. DEXA scan last July reviewed. T score -2.7. Took Fosamax for almost 10 years but this is almost 10 years ago. She takes regular calcium and vitamin D. She stays fairly active with walking  Hyperlipidemia treated with simvastatin. No myalgias. No recent chest pains. No cardiac history  Hypothyroidism treated with levothyroxin. Compliant with therapy. She has bipolar disorder followed by psychiatry and treated with Depakote and sertraline. She has already had flu vaccination.  Past Medical History:  Diagnosis Date  . Allergic rhinitis   . Anxiety   . Depression   . History of colonic polyps   . Hyperlipemia   . Hypothyroidism   . Osteoporosis    Past Surgical History:  Procedure Laterality Date  . APPENDECTOMY    . CESAREAN SECTION  1988  . COLONOSCOPY  1998,2002,2007,2012  . LOBECTOMY  1989  . POLYPECTOMY  1998 only   hyperplastic    reports that she has never smoked. She has never used smokeless tobacco. She reports that she does not drink alcohol or use drugs. family history includes Cancer in her father; Colon cancer in her cousin, maternal grandfather, and paternal uncle; Gout in her father; Hyperlipidemia in her father; Hypothyroidism in her mother; Osteoporosis in her mother. No Known Allergies   Review of Systems  Constitutional: Negative for appetite change, fatigue and unexpected weight change.  Eyes: Negative for visual disturbance.  Respiratory: Negative for cough, chest tightness, shortness of breath and wheezing.   Cardiovascular: Negative for chest pain, palpitations and leg swelling.  Gastrointestinal: Negative for abdominal pain.  Neurological: Negative for  dizziness, seizures, syncope, weakness, light-headedness and headaches.       Objective:   Physical Exam  Constitutional: She appears well-developed and well-nourished.  Eyes: Pupils are equal, round, and reactive to light.  Neck: Neck supple. No JVD present. No thyromegaly present.  Cardiovascular: Normal rate and regular rhythm.  Exam reveals no gallop.   Pulmonary/Chest: Effort normal and breath sounds normal. No respiratory distress. She has no wheezes. She has no rales.  Musculoskeletal: She exhibits no edema.  Neurological: She is alert.       Assessment:     #1 dyslipidemia  #2 hypothyroidism  #3 osteoporosis currently not being treated with medication  #4 bipolar disorder followed by psychiatry and stable    Plan:     -check labs with TSH, basic metabolic panel, lipid panel, hepatic panel -Continue daily calcium and vitamin D and weightbearing exercise -Start back Fosamax 70 mg once weekly with instructions for use -Would plan repeat DEXA scan in 1-1/2 years -Continue close follow-up with psychiatry  Eulas Post MD [facility] at [facility]

## 2015-11-03 NOTE — Patient Instructions (Signed)
Osteoporosis  Osteoporosis is the thinning and loss of density in the bones. Osteoporosis makes the bones more brittle, fragile, and likely to break (fracture). Over time, osteoporosis can cause the bones to become so weak that they fracture after a simple fall. The bones most likely to fracture are the bones in the hip, wrist, and spine.  CAUSES   The exact cause is not known.  RISK FACTORS  Anyone can develop osteoporosis. You may be at greater risk if you have a family history of the condition or have poor nutrition. You may also have a higher risk if you are:   · Female.    · 50 years old or older.  · A smoker.  · Not physically active.    · White or Asian.  · Slender.  SIGNS AND SYMPTOMS   A fracture might be the first sign of the disease, especially if it results from a fall or injury that would not usually cause a bone to break. Other signs and symptoms include:   · Low back and neck pain.  · Stooped posture.  · Height loss.  DIAGNOSIS   To make a diagnosis, your health care provider may:  · Take a medical history.  · Perform a physical exam.  · Order tests, such as:    A bone mineral density test.    A dual-energy X-ray absorptiometry test.  TREATMENT   The goal of osteoporosis treatment is to strengthen your bones to reduce your risk of a fracture. Treatment may involve:  · Making lifestyle changes, such as:    Eating a diet rich in calcium.    Doing weight-bearing and muscle-strengthening exercises.    Stopping tobacco use.    Limiting alcohol intake.  · Taking medicine to slow the process of bone loss or to increase bone density.  · Monitoring your levels of calcium and vitamin D.  HOME CARE INSTRUCTIONS  · Include calcium and vitamin D in your diet. Calcium is important for bone health, and vitamin D helps the body absorb calcium.  · Perform weight-bearing and muscle-strengthening exercises as directed by your health care provider.  · Do not use any tobacco products, including cigarettes, chewing  tobacco, and electronic cigarettes. If you need help quitting, ask your health care provider.  · Limit your alcohol intake.  · Take medicines only as directed by your health care provider.  · Keep all follow-up visits as directed by your health care provider. This is important.  · Take precautions at home to lower your risk of falling, such as:    Keeping rooms well lit and clutter free.    Installing safety rails on stairs.    Using rubber mats in the bathroom and other areas that are often wet or slippery.  SEEK IMMEDIATE MEDICAL CARE IF:   You fall or injure yourself.      This information is not intended to replace advice given to you by your health care provider. Make sure you discuss any questions you have with your health care provider.     Document Released: 10/10/2004 Document Revised: 01/21/2014 Document Reviewed: 06/10/2013  Elsevier Interactive Patient Education ©2016 [employer]

## 2015-11-06 ENCOUNTER — Encounter: Payer: Self-pay | Admitting: Family Medicine

## 2015-11-10 ENCOUNTER — Ambulatory Visit: Payer: Commercial Managed Care - HMO | Admitting: Family Medicine

## 2015-12-04 DIAGNOSIS — F3130 Bipolar disorder, current episode depressed, mild or moderate severity, unspecified: Secondary | ICD-10-CM | POA: Diagnosis not present

## 2016-02-02 ENCOUNTER — Other Ambulatory Visit: Payer: Self-pay | Admitting: Family Medicine

## 2016-02-02 DIAGNOSIS — Z1231 Encounter for screening mammogram for malignant neoplasm of breast: Secondary | ICD-10-CM

## 2016-02-27 ENCOUNTER — Ambulatory Visit
Admission: RE | Admit: 2016-02-27 | Discharge: 2016-02-27 | Disposition: A | Discharge status: Home / Self Care | Payer: Commercial Managed Care - HMO | Source: Ambulatory Visit | Attending: Family Medicine | Admitting: Family Medicine

## 2016-02-27 DIAGNOSIS — Z1231 Encounter for screening mammogram for malignant neoplasm of breast: Secondary | ICD-10-CM | POA: Diagnosis not present

## 2016-03-04 ENCOUNTER — Encounter: Payer: Self-pay | Admitting: Family Medicine

## 2016-04-01 DIAGNOSIS — F3130 Bipolar disorder, current episode depressed, mild or moderate severity, unspecified: Secondary | ICD-10-CM | POA: Diagnosis not present

## 2016-04-12 ENCOUNTER — Other Ambulatory Visit: Payer: Self-pay | Admitting: Family Medicine

## 2016-08-23 ENCOUNTER — Other Ambulatory Visit: Payer: Self-pay | Admitting: Family Medicine

## 2016-08-26 ENCOUNTER — Other Ambulatory Visit (HOSPITAL_COMMUNITY): Payer: Self-pay | Admitting: Psychiatry

## 2016-08-26 ENCOUNTER — Other Ambulatory Visit: Payer: Self-pay | Admitting: Family Medicine

## 2016-08-29 DIAGNOSIS — F3130 Bipolar disorder, current episode depressed, mild or moderate severity, unspecified: Secondary | ICD-10-CM | POA: Diagnosis not present

## 2016-10-03 ENCOUNTER — Encounter: Payer: Self-pay | Admitting: Family Medicine

## 2016-10-03 DIAGNOSIS — H5210 Myopia, unspecified eye: Secondary | ICD-10-CM | POA: Diagnosis not present

## 2016-10-16 ENCOUNTER — Telehealth: Payer: Self-pay | Admitting: Family Medicine

## 2016-10-16 NOTE — Telephone Encounter (Signed)
Pt is calling someone from our office called  her twice and her vm is full.

## 2016-10-16 NOTE — Telephone Encounter (Signed)
Left detailed message on machine for patient that I have not been call, however she does have an upcoming appointment and it is time for a flu vaccine.

## 2016-10-25 NOTE — Progress Notes (Addendum)
Subjective:   Tammy [name] is a 66 y.o. female who presents for Medicare Annual (Subsequent) preventive examination.  AWV completed  08/11/2015 The Patient was informed that the wellness visit is to identify future health risk and educate and initiate measures that can reduce risk for increased disease through the lifespan.     Reports health as good  Preventive Screening -Counseling & Management  Medicare Annual Preventive Care Visit - Subsequent Last OV  11/03/2015  TO MAKE APT WITH DR. Elease Hashimoto  Colonoscopy 7/ 2017 repeat in 2022  Mammogram 02/2016 - annually  Bone density 07/2015 -2.0 - august 2019     VS reviewed;   Diet (buys food for son and she is tempted to eat this)  Buys cheese danish; gets tempted Banana nut muffins  Lunch; whole grain bread; with pb and jelly Supper baked chicken, chicken soup with butternut squash  Mother lives with her   BMI 23  Exercise Going to try to walk outside; 10 to 15 minutes Naps a lot  Increase treadmill time to 10 minu x 2 per day      Cardiac Risk Factors Addressed Hyperlipidemia 10/2015  Chol 209; trig 92; hdl 64; ldl 126    Advanced Directives completed   Patient Care Team: Eulas Post, MD as PCP - General (Family Medicine)   Cardiac Risk Factors include: advanced age (>79men, >79 women);dyslipidemia;sedentary lifestyle     Objective:     Vitals: BP 104/60   Pulse 79   Ht 4\' 11"  (1.499 m)   Wt 114 lb 4 oz (51.8 kg)   SpO2 94%   BMI 23.08 kg/m   Body mass index is 23.08 kg/m.   Tobacco History  Smoking Status  . Never Smoker  Smokeless Tobacco  . Never Used     Counseling given: Yes   Past Medical History:  Diagnosis Date  . Allergic rhinitis   . Anxiety   . Depression   . History of colonic polyps   . Hyperlipemia   . Hypothyroidism   . Osteoporosis    Past Surgical History:  Procedure Laterality Date  . APPENDECTOMY    . BREAST BIOPSY Right 1989   benign  . CESAREAN  SECTION  1988  . COLONOSCOPY  1998,2002,2007,2012  . LOBECTOMY  1989  . POLYPECTOMY  1998 only   hyperplastic   Family History  Problem Relation Age of Onset  . Gout Father   . Hyperlipidemia Father   . Cancer Father        Pancreatic Tumor  . Osteoporosis Mother   . Hypothyroidism Mother   . Hyperlipidemia Unknown   . Colon cancer Paternal Uncle   . Colon cancer Maternal Grandfather   . Colon cancer Cousin   . Breast cancer Maternal Aunt    History  Sexual Activity  . Sexual activity: Not on file    Outpatient Encounter Prescriptions as of 10/29/2016  Medication Sig  . alendronate (FOSAMAX) 70 MG tablet TAKE 1 TABLET EVERY 7 (SEVEN) DAYS. TAKE WITH A FULL GLASS OF WATER ON AN EMPTY STOMACH.  Marland Kitchen aspirin 81 MG tablet Take 81 mg by mouth daily.    . Calcium Citrate-Vitamin D (CITRACAL/VITAMIN D PO) Take by mouth. Takes 1 tablet daily. 500IU vitamin D and 630mg  calcium.  . divalproex (DEPAKOTE ER) 250 MG 24 hr tablet Take 250 mg by mouth daily.   Marland Kitchen levothyroxine (SYNTHROID, LEVOTHROID) 75 MCG tablet TAKE 1 TABLET EVERY DAY  . Multiple Vitamins-Minerals (WOMENS 50+  MULTI VITAMIN/MIN PO) Take by mouth daily.  . sertraline (ZOLOFT) 100 MG tablet Take 200 mg by mouth daily. Take 1 1/2 tablet bu mouth daily.  . simvastatin (ZOCOR) 20 MG tablet TAKE 1 TABLET AT BEDTIME  . Calcium Carbonate (CALCIUM 600 PO) Take by mouth. Takes one tablet daily.   No facility-administered encounter medications on file as of 10/29/2016.     Activities of Daily Living In your present state of health, do you have any difficulty performing the following activities: 10/29/2016  Hearing? Y  Vision? Y  Difficulty concentrating or making decisions? N  Walking or climbing stairs? N  Comment not a lot of endurance   Dressing or bathing? N  Doing errands, shopping? N  Preparing Food and eating ? N  Using the Toilet? N  In the past six months, have you accidently leaked urine? N  Comment can't lift heavy  things   Do you have problems with loss of bowel control? N  Managing your Medications? N  Managing your Finances? N  Housekeeping or managing your Housekeeping? N  Some recent data might be hidden    Patient Care Team: Eulas Post, MD as PCP - General (Family Medicine)    Assessment:     Exercise Activities and Dietary recommendations Current Exercise Habits: Home exercise routine, Time (Minutes): 30, Frequency (Times/Week): 2, Weekly Exercise (Minutes/Week): 60, Intensity: Mild  Goals    . patient          [place]; from 1 to 4pm     . Weight (lb) < 110 lb (49.9 kg)          Eat nutritious meals and enjoy your life  Try to use your treadmill x 2 for 10 min per day       Fall Risk Fall Risk  10/29/2016 08/11/2015 11/09/2014 05/05/2013  Falls in the past year? No No No No   Depression Screen PHQ 2/9 Scores 10/29/2016 08/11/2015 11/09/2014 05/05/2013  PHQ - 2 Score 0 0 1 0     Cognitive Function MMSE - Mini Mental State Exam 10/29/2016 08/11/2015  Not completed: (No Data) (No Data)     Ad8 score reviewed for issues:  Issues making decisions:  Less interest in hobbies / activities:  Repeats questions, stories (family complaining):  Trouble using ordinary gadgets (microwave, computer, phone):  Forgets the month or year:   Mismanaging finances:   Remembering appts:  Daily problems with thinking and/or memory: Ad8 score is=0 Recall 3/3; serial 7s from 100 x 7         Immunization History  Administered Date(s) Administered  . Influenza Split 10/30/2013  . Influenza-Unspecified 10/13/2015, 10/11/2016  . Pneumococcal Conjugate-13 11/09/2014  . Pneumococcal Polysaccharide-23 10/29/2016  . Td 07/14/2007  . Zoster 04/06/2014   Screening Tests Health Maintenance  Topic Date Due  . PNA vac Low Risk Adult (2 of 2 - PPSV23) 11/09/2015  . INFLUENZA VACCINE  08/14/2016  . TETANUS/TDAP  07/13/2017  . MAMMOGRAM   02/26/2018  . COLONOSCOPY  07/18/2020  . DEXA SCAN  Completed  . Hepatitis C Screening  Completed       Plan:      PCP Notes   Health Maintenance Took her PSV 23 vaccine today Ordered labs for visit with Dr. Elease Hashimoto to be schedule within the next week   Had flu vaccine [facility] 10/11/2016   Abnormal Screens  None   Referrals  None  Patient concerns; None   Nurse Concerns; As noted   Next PCP apt To schedule apt with Dr. Elease Hashimoto in the next week to review labs     I have personally reviewed and noted the following in the patient's chart:   . Medical and social history . Use of alcohol, tobacco or illicit drugs  . Current medications and supplements . Functional ability and status . Nutritional status . Physical activity . Advanced directives . List of other physicians . Hospitalizations, surgeries, and ER visits in previous 12 months . Vitals . Screenings to include cognitive, depression, and falls . Referrals and appointments  In addition, I have reviewed and discussed with patient certain preventive protocols, quality metrics, and best practice recommendations. A written personalized care plan for preventive services as well as general preventive health recommendations were provided to patient.     VHQIO,NGEXB, RN  10/29/2016  Agree with assessment as above.  Eulas Post MD [facility] at [facility]

## 2016-10-29 ENCOUNTER — Ambulatory Visit (INDEPENDENT_AMBULATORY_CARE_PROVIDER_SITE_OTHER): Payer: Commercial Managed Care - HMO

## 2016-10-29 VITALS — BP 104/60 | HR 79 | Ht 59.0 in | Wt 114.2 lb

## 2016-10-29 DIAGNOSIS — E039 Hypothyroidism, unspecified: Secondary | ICD-10-CM | POA: Diagnosis not present

## 2016-10-29 DIAGNOSIS — Z23 Encounter for immunization: Secondary | ICD-10-CM

## 2016-10-29 DIAGNOSIS — E785 Hyperlipidemia, unspecified: Secondary | ICD-10-CM

## 2016-10-29 DIAGNOSIS — M810 Age-related osteoporosis without current pathological fracture: Secondary | ICD-10-CM | POA: Diagnosis not present

## 2016-10-29 DIAGNOSIS — Z0000 Encounter for general adult medical examination without abnormal findings: Secondary | ICD-10-CM

## 2016-10-29 NOTE — Patient Instructions (Addendum)
Tammy [name] , Thank you for taking time to come for your Medicare Wellness Visit. I appreciate your ongoing commitment to your health goals. Please review the following plan we discussed and let me know if I can assist you in the future.   The Centers for Disease Control are now recommending 2 pneumonia vaccinations after 39. The first is the Prevnar 13. This helps to boost your immunity to community acquired pneumonia as well as some protection from bacterial pneumonia  The 2nd is the pneumovax 23, which offers more broad protection!  Please consider taking these as this is your best protection against pneumonia.  Will make apt with Dr. Elease Hashimoto but can have your blood work today Will take your PSV 23 pneumonia vaccine today     These are the goals we discussed: Goals    . patient          [place] and book; from 1 to 4pm     . Weight (lb) < 110 lb (49.9 kg)          Eat nutritious meals and enjoy your life  Try to use your treadmill x 2 for 10 min per day        This is a list of the screening recommended for you and due dates:  Health Maintenance  Topic Date Due  . Pneumonia vaccines (2 of 2 - PPSV23) 11/09/2015  . Flu Shot  08/14/2016  . Tetanus Vaccine  07/13/2017  . Mammogram  02/26/2018  . Colon Cancer Screening  07/18/2020  . DEXA scan (bone density measurement)  Completed  .  Hepatitis C: One time screening is recommended by Center for Disease Control  (CDC) for  adults born from [birth year] through 1965.   Completed        Fall Prevention in the Home Falls can cause injuries. They can happen to people of all ages. There are many things you can do to make your home safe and to help prevent falls. What can I do on the outside of my home?  Regularly fix the edges of walkways and driveways and fix any cracks.  Remove anything that might make you trip as you walk through a door, such as a raised step or threshold.  Trim any bushes or  trees on the path to your home.  Use bright outdoor lighting.  Clear any walking paths of anything that might make someone trip, such as rocks or tools.  Regularly check to see if handrails are loose or broken. Make sure that both sides of any steps have handrails.  Any raised decks and porches should have guardrails on the edges.  Have any leaves, snow, or ice cleared regularly.  Use sand or salt on walking paths during winter.  Clean up any spills in your garage right away. This includes oil or grease spills. What can I do in the bathroom?  Use night lights.  Install grab bars by the toilet and in the tub and shower. Do not use towel bars as grab bars.  Use non-skid mats or decals in the tub or shower.  If you need to sit down in the shower, use a plastic, non-slip stool.  Keep the floor dry. Clean up any water that spills on the floor as soon as it happens.  Remove soap buildup in the tub or shower regularly.  Attach bath mats securely with double-sided non-slip rug tape.  Do not have throw rugs and  other things on the floor that can make you trip. What can I do in the bedroom?  Use night lights.  Make sure that you have a light by your bed that is easy to reach.  Do not use any sheets or blankets that are too big for your bed. They should not hang down onto the floor.  Have a firm chair that has side arms. You can use this for support while you get dressed.  Do not have throw rugs and other things on the floor that can make you trip. What can I do in the kitchen?  Clean up any spills right away.  Avoid walking on wet floors.  Keep items that you use a lot in easy-to-reach places.  If you need to reach something above you, use a strong step stool that has a grab bar.  Keep electrical cords out of the way.  Do not use floor polish or wax that makes floors slippery. If you must use wax, use non-skid floor wax.  Do not have throw rugs and other things on the  floor that can make you trip. What can I do with my stairs?  Do not leave any items on the stairs.  Make sure that there are handrails on both sides of the stairs and use them. Fix handrails that are broken or loose. Make sure that handrails are as long as the stairways.  Check any carpeting to make sure that it is firmly attached to the stairs. Fix any carpet that is loose or worn.  Avoid having throw rugs at the top or bottom of the stairs. If you do have throw rugs, attach them to the floor with carpet tape.  Make sure that you have a light switch at the top of the stairs and the bottom of the stairs. If you do not have them, ask someone to add them for you. What else can I do to help prevent falls?  Wear shoes that: ? Do not have high heels. ? Have rubber bottoms. ? Are comfortable and fit you well. ? Are closed at the toe. Do not wear sandals.  If you use a stepladder: ? Make sure that it is fully opened. Do not climb a closed stepladder. ? Make sure that both sides of the stepladder are locked into place. ? Ask someone to hold it for you, if possible.  Clearly mark and make sure that you can see: ? Any grab bars or handrails. ? First and last steps. ? Where the edge of each step is.  Use tools that help you move around (mobility aids) if they are needed. These include: ? Canes. ? Walkers. ? Scooters. ? Crutches.  Turn on the lights when you go into a dark area. Replace any light bulbs as soon as they burn out.  Set up your furniture so you have a clear path. Avoid moving your furniture around.  If any of your floors are uneven, fix them.  If there are any pets around you, be aware of where they are.  Review your medicines with your doctor. Some medicines can make you feel dizzy. This can increase your chance of falling. Ask your doctor what other things that you can do to help prevent falls. This information is not intended to replace advice given to you by your  health care provider. Make sure you discuss any questions you have with your health care provider. Document Released: 10/27/2008 Document Revised: 06/08/2015 Document Reviewed: 02/04/2014 Elsevier Interactive  Patient Education  2018 [place] Maintenance, Female Adopting a healthy lifestyle and getting preventive care can go a long way to promote health and wellness. Talk with your health care provider about what schedule of regular examinations is right for you. This is a good chance for you to check in with your provider about disease prevention and staying healthy. In between checkups, there are plenty of things you can do on your own. Experts have done a lot of research about which lifestyle changes and preventive measures are most likely to keep you healthy. Ask your health care provider for more information. Weight and diet Eat a healthy diet  Be sure to include plenty of vegetables, fruits, low-fat dairy products, and lean protein.  Do not eat a lot of foods high in solid fats, added sugars, or salt.  Get regular exercise. This is one of the most important things you can do for your health. ? Most adults should exercise for at least 150 minutes each week. The exercise should increase your heart rate and make you sweat (moderate-intensity exercise). ? Most adults should also do strengthening exercises at least twice a week. This is in addition to the moderate-intensity exercise.  Maintain a healthy weight  Body mass index (BMI) is a measurement that can be used to identify possible weight problems. It estimates body fat based on height and weight. Your health care provider can help determine your BMI and help you achieve or maintain a healthy weight.  For females 53 years of age and older: ? A BMI below 18.5 is considered underweight. ? A BMI of 18.5 to 24.9 is normal. ? A BMI of 25 to 29.9 is considered overweight. ? A BMI of 30 and above is considered obese.  Watch  levels of cholesterol and blood lipids  You should start having your blood tested for lipids and cholesterol at 68 years of age, then have this test every 5 years.  You may need to have your cholesterol levels checked more often if: ? Your lipid or cholesterol levels are high. ? You are older than 68 years of age. ? You are at high risk for heart disease.  Cancer screening Lung Cancer  Lung cancer screening is recommended for adults 82-57 years old who are at high risk for lung cancer because of a history of smoking.  A yearly low-dose CT scan of the lungs is recommended for people who: ? Currently smoke. ? Have quit within the past 15 years. ? Have at least a 30-pack-year history of smoking. A pack year is smoking an average of one pack of cigarettes a day for 1 year.  Yearly screening should continue until it has been 15 years since you quit.  Yearly screening should stop if you develop a health problem that would prevent you from having lung cancer treatment.  Breast Cancer  Practice breast self-awareness. This means understanding how your breasts normally appear and feel.  It also means doing regular breast self-exams. Let your health care provider know about any changes, no matter how small.  If you are in your 20s or 30s, you should have a clinical breast exam (CBE) by a health care provider every 1-3 years as part of a regular health exam.  If you are 47 or older, have a CBE every year. Also consider having a breast X-ray (mammogram) every year.  If you have a family history of breast cancer, talk to your health care provider about genetic screening.  If you are at high risk for breast cancer, talk to your health care provider about having an MRI and a mammogram every year.  Breast cancer gene (BRCA) assessment is recommended for women who have family members with BRCA-related cancers. BRCA-related cancers include: ? Breast. ? Ovarian. ? Tubal. ? Peritoneal  cancers.  Results of the assessment will determine the need for genetic counseling and BRCA1 and BRCA2 testing.  Cervical Cancer Your health care provider may recommend that you be screened regularly for cancer of the pelvic organs (ovaries, uterus, and vagina). This screening involves a pelvic examination, including checking for microscopic changes to the surface of your cervix (Pap test). You may be encouraged to have this screening done every 3 years, beginning at age 70.  For women ages 18-65, health care providers may recommend pelvic exams and Pap testing every 3 years, or they may recommend the Pap and pelvic exam, combined with testing for human papilloma virus (HPV), every 5 years. Some types of HPV increase your risk of cervical cancer. Testing for HPV may also be done on women of any age with unclear Pap test results.  Other health care providers may not recommend any screening for nonpregnant women who are considered low risk for pelvic cancer and who do not have symptoms. Ask your health care provider if a screening pelvic exam is right for you.  If you have had past treatment for cervical cancer or a condition that could lead to cancer, you need Pap tests and screening for cancer for at least 20 years after your treatment. If Pap tests have been discontinued, your risk factors (such as having a new sexual partner) need to be reassessed to determine if screening should resume. Some women have medical problems that increase the chance of getting cervical cancer. In these cases, your health care provider may recommend more frequent screening and Pap tests.  Colorectal Cancer  This type of cancer can be detected and often prevented.  Routine colorectal cancer screening usually begins at 68 years of age and continues through 68 years of age.  Your health care provider may recommend screening at an earlier age if you have risk factors for colon cancer.  Your health care provider may also  recommend using home test kits to check for hidden blood in the stool.  A small camera at the end of a tube can be used to examine your colon directly (sigmoidoscopy or colonoscopy). This is done to check for the earliest forms of colorectal cancer.  Routine screening usually begins at age 76.  Direct examination of the colon should be repeated every 5-10 years through 68 years of age. However, you may need to be screened more often if early forms of precancerous polyps or small growths are found.  Skin Cancer  Check your skin from head to toe regularly.  Tell your health care provider about any new moles or changes in moles, especially if there is a change in a mole's shape or color.  Also tell your health care provider if you have a mole that is larger than the size of a pencil eraser.  Always use sunscreen. Apply sunscreen liberally and repeatedly throughout the day.  Protect yourself by wearing long sleeves, pants, a wide-brimmed hat, and sunglasses whenever you are outside.  Heart disease, diabetes, and high blood pressure  High blood pressure causes heart disease and increases the risk of stroke. High blood pressure is more likely to develop in: ? People who have blood  pressure in the high end of the normal range (130-139/85-89 mm Hg). ? People who are overweight or obese. ? People who are African American.  If you are 85-2 years of age, have your blood pressure checked every 3-5 years. If you are 105 years of age or older, have your blood pressure checked every year. You should have your blood pressure measured twice-once when you are at a hospital or clinic, and once when you are not at a hospital or clinic. Record the average of the two measurements. To check your blood pressure when you are not at a hospital or clinic, you can use: ? An automated blood pressure machine at a pharmacy. ? A home blood pressure monitor.  If you are between 13 years and 18 years old, ask your  health care provider if you should take aspirin to prevent strokes.  Have regular diabetes screenings. This involves taking a blood sample to check your fasting blood sugar level. ? If you are at a normal weight and have a low risk for diabetes, have this test once every three years after 68 years of age. ? If you are overweight and have a high risk for diabetes, consider being tested at a younger age or more often. Preventing infection Hepatitis B  If you have a higher risk for hepatitis B, you should be screened for this virus. You are considered at high risk for hepatitis B if: ? You were born in a country where hepatitis B is common. Ask your health care provider which countries are considered high risk. ? Your parents were born in a high-risk country, and you have not been immunized against hepatitis B (hepatitis B vaccine). ? You have HIV or AIDS. ? You use needles to inject street drugs. ? You live with someone who has hepatitis B. ? You have had sex with someone who has hepatitis B. ? You get hemodialysis treatment. ? You take certain medicines for conditions, including cancer, organ transplantation, and autoimmune conditions.  Hepatitis C  Blood testing is recommended for: ? Everyone born from [birth year] through 1965. ? Anyone with known risk factors for hepatitis C.  Sexually transmitted infections (STIs)  You should be screened for sexually transmitted infections (STIs) including gonorrhea and chlamydia if: ? You are sexually active and are younger than 68 years of age. ? You are older than 68 years of age and your health care provider tells you that you are at risk for this type of infection. ? Your sexual activity has changed since you were last screened and you are at an increased risk for chlamydia or gonorrhea. Ask your health care provider if you are at risk.  If you do not have HIV, but are at risk, it may be recommended that you take a prescription medicine daily to  prevent HIV infection. This is called pre-exposure prophylaxis (PrEP). You are considered at risk if: ? You are sexually active and do not regularly use condoms or know the HIV status of your partner(s). ? You take drugs by injection. ? You are sexually active with a partner who has HIV.  Talk with your health care provider about whether you are at high risk of being infected with HIV. If you choose to begin PrEP, you should first be tested for HIV. You should then be tested every 3 months for as long as you are taking PrEP. Pregnancy  If you are premenopausal and you may become pregnant, ask your health care provider about preconception  counseling.  If you may become pregnant, take 400 to 800 micrograms (mcg) of folic acid every day.  If you want to prevent pregnancy, talk to your health care provider about birth control (contraception). Osteoporosis and menopause  Osteoporosis is a disease in which the bones lose minerals and strength with aging. This can result in serious bone fractures. Your risk for osteoporosis can be identified using a bone density scan.  If you are 59 years of age or older, or if you are at risk for osteoporosis and fractures, ask your health care provider if you should be screened.  Ask your health care provider whether you should take a calcium or vitamin D supplement to lower your risk for osteoporosis.  Menopause may have certain physical symptoms and risks.  Hormone replacement therapy may reduce some of these symptoms and risks. Talk to your health care provider about whether hormone replacement therapy is right for you. Follow these instructions at home:  Schedule regular health, dental, and eye exams.  Stay current with your immunizations.  Do not use any tobacco products including cigarettes, chewing tobacco, or electronic cigarettes.  If you are pregnant, do not drink alcohol.  If you are breastfeeding, limit how much and how often you drink  alcohol.  Limit alcohol intake to no more than 1 drink per day for nonpregnant women. One drink equals 12 ounces of beer, 5 ounces of wine, or 1 ounces of hard liquor.  Do not use street drugs.  Do not share needles.  Ask your health care provider for help if you need support or information about quitting drugs.  Tell your health care provider if you often feel depressed.  Tell your health care provider if you have ever been abused or do not feel safe at home. This information is not intended to replace advice given to you by your health care provider. Make sure you discuss any questions you have with your health care provider. Document Released: 07/16/2010 Document Revised: 06/08/2015 Document Reviewed: 10/04/2014 Elsevier Interactive Patient Education  [employer].

## 2016-10-30 LAB — COMPREHENSIVE METABOLIC PANEL
ALBUMIN: 4.3 g/dL (ref 3.5–5.2)
ALT: 15 U/L (ref 0–35)
AST: 24 U/L (ref 0–37)
Alkaline Phosphatase: 50 U/L (ref 39–117)
BILIRUBIN TOTAL: 0.6 mg/dL (ref 0.2–1.2)
BUN: 21 mg/dL (ref 6–23)
CALCIUM: 9 mg/dL (ref 8.4–10.5)
CHLORIDE: 101 meq/L (ref 96–112)
CO2: 30 meq/L (ref 19–32)
Creatinine, Ser: 0.68 mg/dL (ref 0.40–1.20)
GFR: 91.45 mL/min (ref >60.00)
Glucose, Bld: 84 mg/dL (ref 70–99)
Potassium: 3.9 mEq/L (ref 3.5–5.1)
Sodium: 140 mEq/L (ref 135–145)
Total Protein: 6.8 g/dL (ref 6.0–8.3)

## 2016-10-30 LAB — LIPID PANEL
CHOLESTEROL: 189 mg/dL (ref 0–200)
HDL: 55.9 mg/dL (ref >39.00)
LDL Cholesterol: 116 mg/dL — ABNORMAL HIGH (ref 0–99)
NonHDL: 133.22
TRIGLYCERIDES: 87 mg/dL (ref 0.0–149.0)
Total CHOL/HDL Ratio: 3
VLDL: 17.4 mg/dL (ref 0.0–40.0)

## 2016-10-30 LAB — CBC WITH DIFFERENTIAL/PLATELET
BASOS PCT: 0.8 % (ref 0.0–3.0)
Basophils Absolute: 0 10*3/uL (ref 0.0–0.1)
Eosinophils Absolute: 0.1 10*3/uL (ref 0.0–0.7)
Eosinophils Relative: 2.2 % (ref 0.0–5.0)
HEMATOCRIT: 36.7 % (ref 36.0–46.0)
Hemoglobin: 12.4 g/dL (ref 12.0–15.0)
LYMPHS PCT: 47.9 % — AB (ref 12.0–46.0)
Lymphs Abs: 2.1 10*3/uL (ref 0.7–4.0)
MCHC: 33.8 g/dL (ref 30.0–36.0)
MCV: 93 fl (ref 78.0–100.0)
MONOS PCT: 6.5 % (ref 3.0–12.0)
Monocytes Absolute: 0.3 10*3/uL (ref 0.1–1.0)
NEUTROS ABS: 1.9 10*3/uL (ref 1.4–7.7)
Neutrophils Relative %: 42.6 % — ABNORMAL LOW (ref 43.0–77.0)
PLATELETS: 177 10*3/uL (ref 150.0–400.0)
RBC: 3.94 Mil/uL (ref 3.87–5.11)
RDW: 13.9 % (ref 11.5–15.5)
WBC: 4.4 10*3/uL (ref 4.0–10.5)

## 2016-10-30 LAB — HEPATIC FUNCTION PANEL
ALK PHOS: 50 U/L (ref 39–117)
ALT: 15 U/L (ref 0–35)
AST: 24 U/L (ref 0–37)
Albumin: 4.3 g/dL (ref 3.5–5.2)
BILIRUBIN DIRECT: 0.1 mg/dL (ref 0.0–0.3)
BILIRUBIN TOTAL: 0.6 mg/dL (ref 0.2–1.2)
TOTAL PROTEIN: 6.8 g/dL (ref 6.0–8.3)

## 2016-10-30 LAB — TSH: TSH: 5.35 u[IU]/mL — AB (ref 0.35–4.50)

## 2016-10-31 ENCOUNTER — Encounter: Payer: Self-pay | Admitting: Family Medicine

## 2016-11-05 ENCOUNTER — Other Ambulatory Visit: Payer: Self-pay | Admitting: Family Medicine

## 2016-11-08 ENCOUNTER — Ambulatory Visit (INDEPENDENT_AMBULATORY_CARE_PROVIDER_SITE_OTHER): Payer: Medicare HMO | Admitting: Family Medicine

## 2016-11-08 VITALS — BP 108/70 | HR 74 | Temp 97.6°F | Ht 59.0 in | Wt 114.1 lb

## 2016-11-08 DIAGNOSIS — E039 Hypothyroidism, unspecified: Secondary | ICD-10-CM

## 2016-11-08 MED ORDER — LEVOTHYROXINE SODIUM 88 MCG PO TABS: 88.0000 ug | ORAL_TABLET | Freq: Every day | ORAL | 3 refills | Status: DC

## 2016-11-08 NOTE — Progress Notes (Signed)
Subjective:     Patient ID: Tammy [name], female   DOB: 1950-11-08, 66 y.o.   MRN: [id number]  HPI Patient here to discuss recent lab work. TSH came back 5.3. She takes levothyroxin 75 g daily. Compliant with therapy. She takes this appropriately by itself early morning. She has had some recent weight gain. Occasional fatigue. Other labs unremarkable.  Past Medical History:  Diagnosis Date  . Allergic rhinitis   . Anxiety   . Depression   . History of colonic polyps   . Hyperlipemia   . Hypothyroidism   . Osteoporosis    Past Surgical History:  Procedure Laterality Date  . APPENDECTOMY    . BREAST BIOPSY Right 1989   benign  . CESAREAN SECTION  1988  . COLONOSCOPY  1998,2002,2007,2012  . LOBECTOMY  1989  . POLYPECTOMY  1998 only   hyperplastic    reports that she has never smoked. She has never used smokeless tobacco. She reports that she does not drink alcohol or use drugs. family history includes Breast cancer in her maternal aunt; Cancer in her father; Colon cancer in her cousin, maternal grandfather, and paternal uncle; Gout in her father; Hyperlipidemia in her father and unknown relative; Hypothyroidism in her mother; Osteoporosis in her mother. No Known Allergies   Review of Systems  Constitutional: Negative for appetite change, chills and fever.  Respiratory: Negative for cough and shortness of breath.   Cardiovascular: Negative for chest pain.  Gastrointestinal: Negative for abdominal pain.  Genitourinary: Negative for dysuria.       Objective:   Physical Exam  Constitutional: She appears well-developed and well-nourished.  Neck: Neck supple. No thyromegaly present.  Cardiovascular: Normal rate and regular rhythm.   Pulmonary/Chest: Effort normal and breath sounds normal. No respiratory distress. She has no wheezes. She has no rales.  Musculoskeletal: She exhibits no edema.  Lymphadenopathy:    She has no cervical adenopathy.       Assessment:      Hypothyroidism. Under replaced by recent labs    Plan:     -Increase levothyroxine to 88 g daily -Recheck TSH in 2-3 months  Eulas Post MD [facility]

## 2016-11-16 ENCOUNTER — Encounter: Payer: Self-pay | Admitting: Family Medicine

## 2016-12-10 DIAGNOSIS — H25043 Posterior subcapsular polar age-related cataract, bilateral: Secondary | ICD-10-CM | POA: Diagnosis not present

## 2016-12-10 DIAGNOSIS — H2512 Age-related nuclear cataract, left eye: Secondary | ICD-10-CM | POA: Diagnosis not present

## 2016-12-10 DIAGNOSIS — H2513 Age-related nuclear cataract, bilateral: Secondary | ICD-10-CM | POA: Diagnosis not present

## 2016-12-10 DIAGNOSIS — H25012 Cortical age-related cataract, left eye: Secondary | ICD-10-CM | POA: Diagnosis not present

## 2016-12-10 DIAGNOSIS — H25013 Cortical age-related cataract, bilateral: Secondary | ICD-10-CM | POA: Diagnosis not present

## 2016-12-10 DIAGNOSIS — H18413 Arcus senilis, bilateral: Secondary | ICD-10-CM | POA: Diagnosis not present

## 2017-01-03 ENCOUNTER — Other Ambulatory Visit: Payer: Self-pay | Admitting: Family Medicine

## 2017-01-10 ENCOUNTER — Other Ambulatory Visit (HOSPITAL_COMMUNITY): Payer: Self-pay | Admitting: Psychiatry

## 2017-01-14 HISTORY — PX: CATARACT EXTRACTION BILATERAL W/ ANTERIOR VITRECTOMY: SHX1304

## 2017-01-15 ENCOUNTER — Other Ambulatory Visit: Payer: Self-pay | Admitting: Family Medicine

## 2017-01-15 DIAGNOSIS — Z1231 Encounter for screening mammogram for malignant neoplasm of breast: Secondary | ICD-10-CM

## 2017-01-17 DIAGNOSIS — H2512 Age-related nuclear cataract, left eye: Secondary | ICD-10-CM | POA: Diagnosis not present

## 2017-01-17 DIAGNOSIS — H2511 Age-related nuclear cataract, right eye: Secondary | ICD-10-CM | POA: Diagnosis not present

## 2017-01-31 DIAGNOSIS — H2511 Age-related nuclear cataract, right eye: Secondary | ICD-10-CM | POA: Diagnosis not present

## 2017-02-03 DIAGNOSIS — F3130 Bipolar disorder, current episode depressed, mild or moderate severity, unspecified: Secondary | ICD-10-CM | POA: Diagnosis not present

## 2017-02-04 ENCOUNTER — Telehealth: Payer: Self-pay | Admitting: Family Medicine

## 2017-02-04 NOTE — Telephone Encounter (Signed)
Copied from [place]. Topic: Quick Communication - See Telephone Encounter >> Feb 04, 2017  2:28 PM Conception Chancy, NT wrote: CRM for notification. See Telephone encounter for:  02/04/17.  Pt calling stating she was seen by her psyschiatrist  02/03/17 and he is requesting her recent lab work faxed to him, [phone]

## 2017-02-04 NOTE — Telephone Encounter (Signed)
Copy faxed and confirmed and patient is aware

## 2017-02-07 DIAGNOSIS — H2511 Age-related nuclear cataract, right eye: Secondary | ICD-10-CM | POA: Diagnosis not present

## 2017-02-27 ENCOUNTER — Ambulatory Visit
Admission: RE | Admit: 2017-02-27 | Discharge: 2017-02-27 | Disposition: A | Discharge status: Home / Self Care | Payer: Medicare HMO | Source: Ambulatory Visit | Attending: Family Medicine | Admitting: Family Medicine

## 2017-02-27 DIAGNOSIS — Z1231 Encounter for screening mammogram for malignant neoplasm of breast: Secondary | ICD-10-CM | POA: Diagnosis not present

## 2017-02-28 DIAGNOSIS — Z0100 Encounter for examination of eyes and vision without abnormal findings: Secondary | ICD-10-CM | POA: Diagnosis not present

## 2017-03-05 ENCOUNTER — Ambulatory Visit (INDEPENDENT_AMBULATORY_CARE_PROVIDER_SITE_OTHER): Payer: Medicare HMO | Admitting: Family Medicine

## 2017-03-05 ENCOUNTER — Encounter: Payer: Self-pay | Admitting: Family Medicine

## 2017-03-05 VITALS — BP 92/64 | HR 81 | Temp 98.3°F | Wt 112.1 lb

## 2017-03-05 DIAGNOSIS — E039 Hypothyroidism, unspecified: Secondary | ICD-10-CM

## 2017-03-05 DIAGNOSIS — H8111 Benign paroxysmal vertigo, right ear: Secondary | ICD-10-CM

## 2017-03-05 NOTE — Patient Instructions (Signed)

## 2017-03-05 NOTE — Progress Notes (Signed)
Subjective:     Patient ID: Tammy [name], female   DOB: 1950-09-14, 67 y.o.   MRN: [id number]  HPI Patient seen with chief complaint of dizziness. Onset Sunday when she went to lay down. She had fairly severe vertigo symptoms that lasted for several seconds. She has some chronic hearing changes but no recent acute changes. Denies any nausea or vomiting. No syncope. No lightheadedness. Denies any associated weakness. No ataxia. She has occasional headaches but nothing new. No recent head injury. No speech changes. No swallowing difficulties. No alleviating factors.  Hx of hypothyroidism with slightly high TSH of 5.35 last October.  Compliant with therapy.  Past Medical History:  Diagnosis Date  . Allergic rhinitis   . Anxiety   . Depression   . History of colonic polyps   . Hyperlipemia   . Hypothyroidism   . Osteoporosis    Past Surgical History:  Procedure Laterality Date  . APPENDECTOMY    . BREAST BIOPSY Right 1989   benign  . CESAREAN SECTION  1988  . COLONOSCOPY  1998,2002,2007,2012  . LOBECTOMY  1989  . POLYPECTOMY  1998 only   hyperplastic    reports that  has never smoked. she has never used smokeless tobacco. She reports that she does not drink alcohol or use drugs. family history includes Breast cancer in her maternal aunt; Cancer in her father; Colon cancer in her cousin, maternal grandfather, and paternal uncle; Gout in her father; Hyperlipidemia in her father and unknown relative; Hypothyroidism in her mother; Osteoporosis in her mother. No Known Allergies   Review of Systems  Constitutional: Negative for fatigue.  Eyes: Negative for visual disturbance.  Respiratory: Negative for cough, chest tightness, shortness of breath and wheezing.   Cardiovascular: Negative for chest pain, palpitations and leg swelling.  Neurological: Positive for dizziness and headaches. Negative for seizures, syncope, weakness and light-headedness.       Objective:   Physical Exam   Constitutional: She is oriented to person, place, and time. She appears well-developed and well-nourished.  Eyes: Pupils are equal, round, and reactive to light.  Neck: Neck supple. No JVD present. No thyromegaly present.  Cardiovascular: Normal rate and regular rhythm. Exam reveals no gallop.  Pulmonary/Chest: Effort normal and breath sounds normal. No respiratory distress. She has no wheezes. She has no rales.  Musculoskeletal: She exhibits no edema.  Neurological: She is alert and oriented to person, place, and time. No cranial nerve deficit. Coordination normal.  No focal strength deficits. Gait normal. Positive Dix-Hallpike maneuver with head to the right. No nystagmus noted       Assessment:     #1 Benign peripheral positional vertigo to the right  #2 hypothyroidism.    Plan:     -Handout given on Epley maneuvers. She was instructed in how to do these. Touch base if symptoms not resolving by early next week. Consider referral for vestibular rehabilitation if not improving with home exercises. Discussed limitations of medications in treating this -future order for repeat TSH in place and she needs to get this soon.  Eulas Post MD [facility]

## 2017-03-24 ENCOUNTER — Other Ambulatory Visit: Payer: Self-pay | Admitting: Family Medicine

## 2017-04-04 DIAGNOSIS — H10022 Other mucopurulent conjunctivitis, left eye: Secondary | ICD-10-CM | POA: Diagnosis not present

## 2017-04-30 DIAGNOSIS — H0234 Blepharochalasis left upper eyelid: Secondary | ICD-10-CM | POA: Diagnosis not present

## 2017-04-30 DIAGNOSIS — H0231 Blepharochalasis right upper eyelid: Secondary | ICD-10-CM | POA: Diagnosis not present

## 2017-05-28 ENCOUNTER — Other Ambulatory Visit: Payer: Self-pay | Admitting: Family Medicine

## 2017-05-28 NOTE — Telephone Encounter (Signed)
Last bone density 07/26/15. Would plan repeat DEXA scan in 1-1/2 years Okay to fill?

## 2017-05-28 NOTE — Telephone Encounter (Signed)
Repeat DEXA by July.  OK to refill for 6 months.

## 2017-06-23 DIAGNOSIS — F3130 Bipolar disorder, current episode depressed, mild or moderate severity, unspecified: Secondary | ICD-10-CM | POA: Diagnosis not present

## 2017-07-12 IMAGING — MG MM SCREENING BREAST TOMO BILATERAL
9 series · 9 of 25 positions shown · non-contrast
Comparison: Previous exam(s).

CLINICAL DATA: Screening.

EXAM:
DIGITAL SCREENING BILATERAL MAMMOGRAM WITH 3D TOMO WITH CAD

[R CC (1 of 2)]
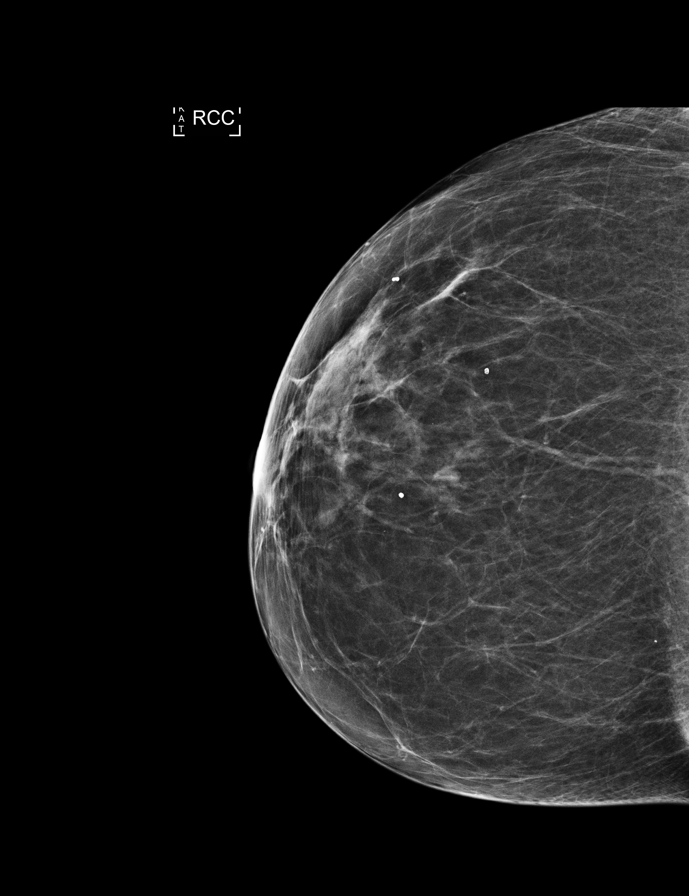

[R CC (2 of 2)]
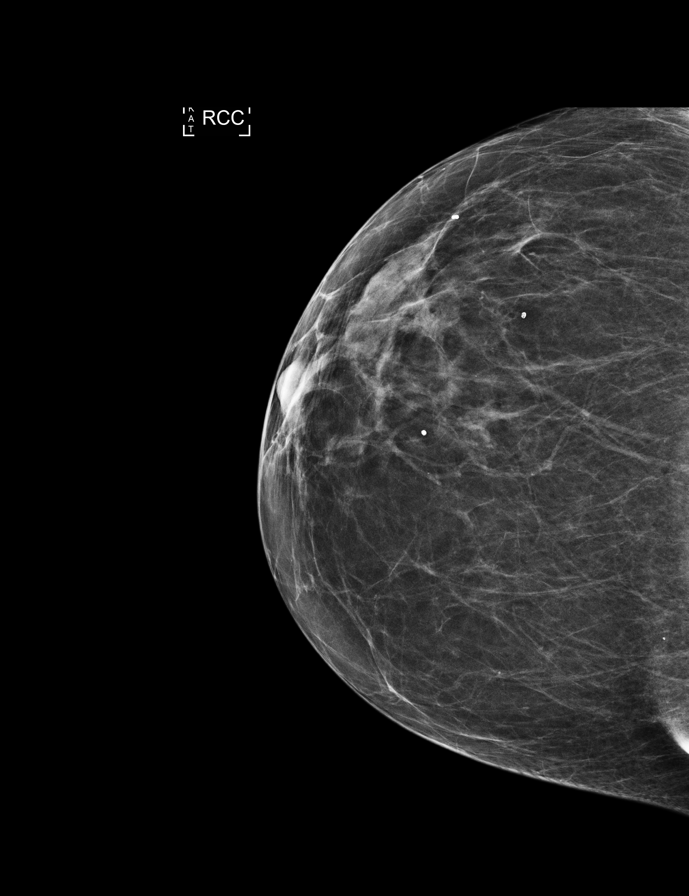

[R MLO]
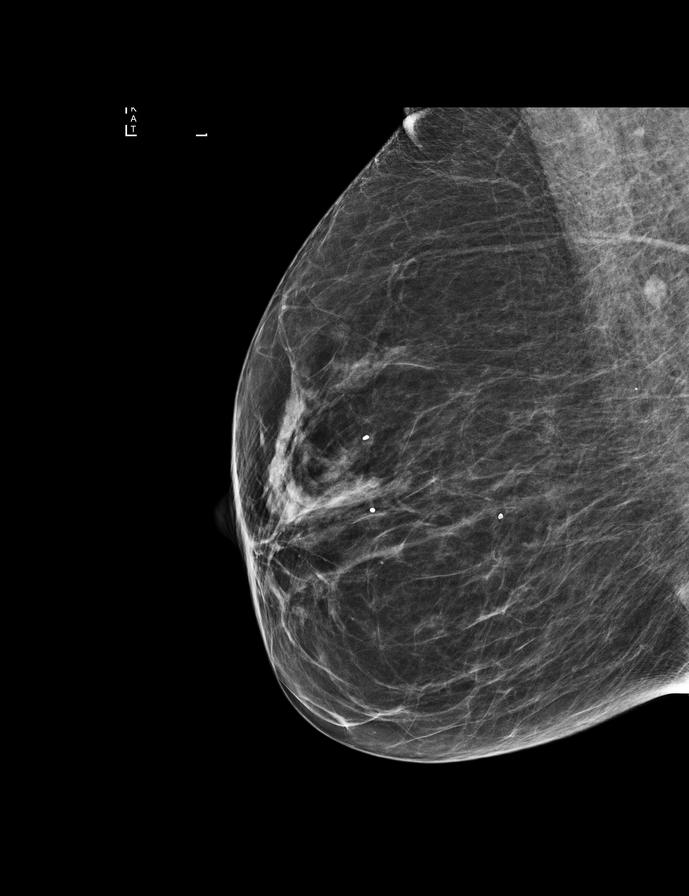

[L CC]
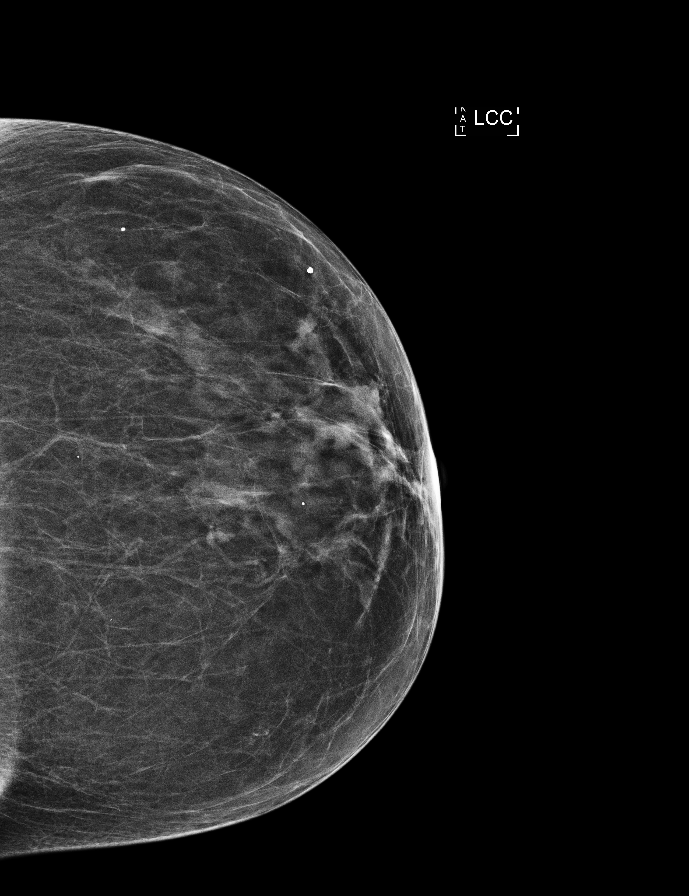

[L MLO]
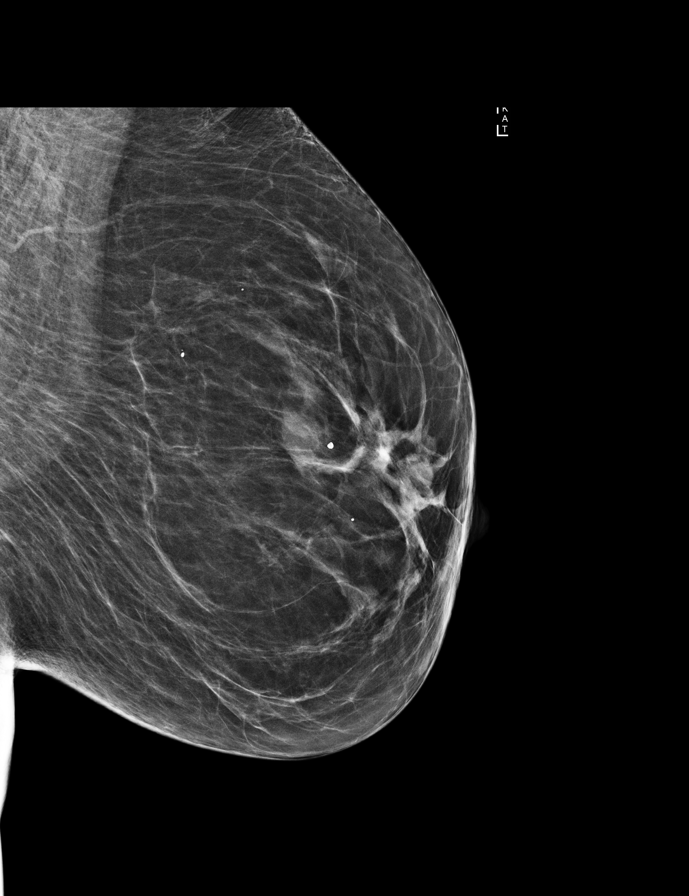

[L MLO tomo · tomo slice 27/52.0]
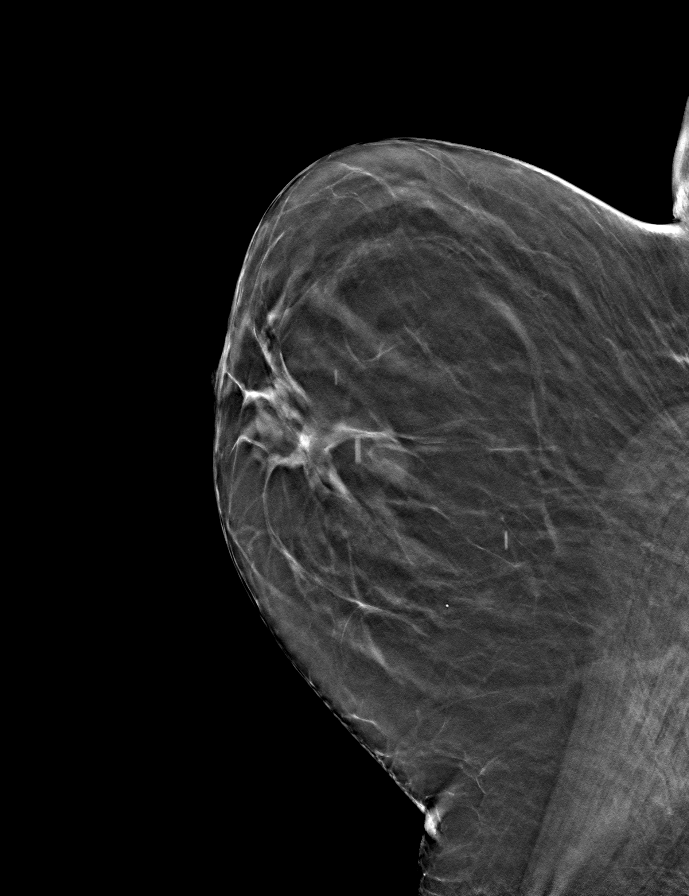

[R CC tomo · tomo slice 23/45.0]
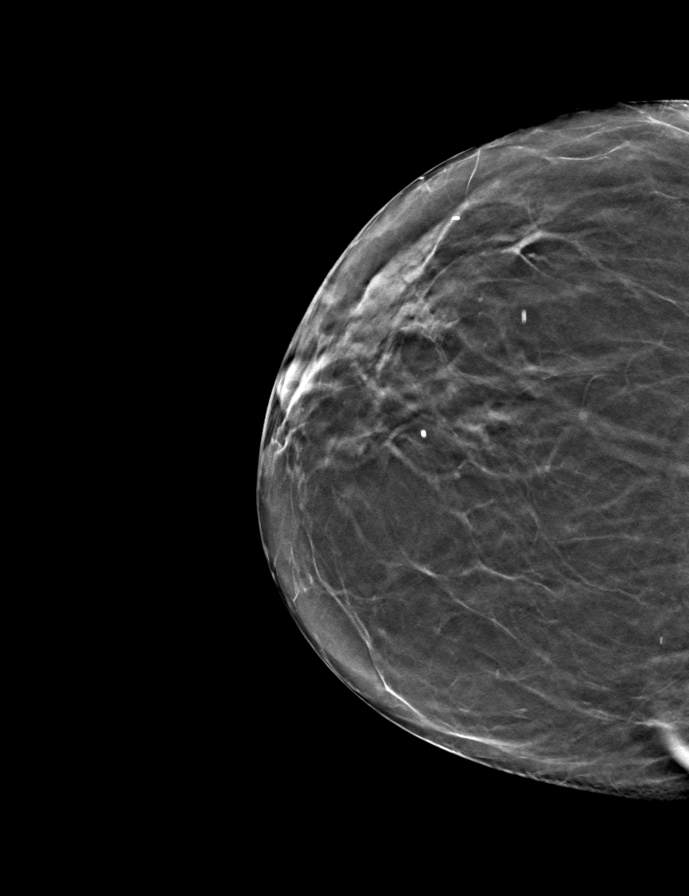

[L CC tomo · tomo slice 22/43.0]
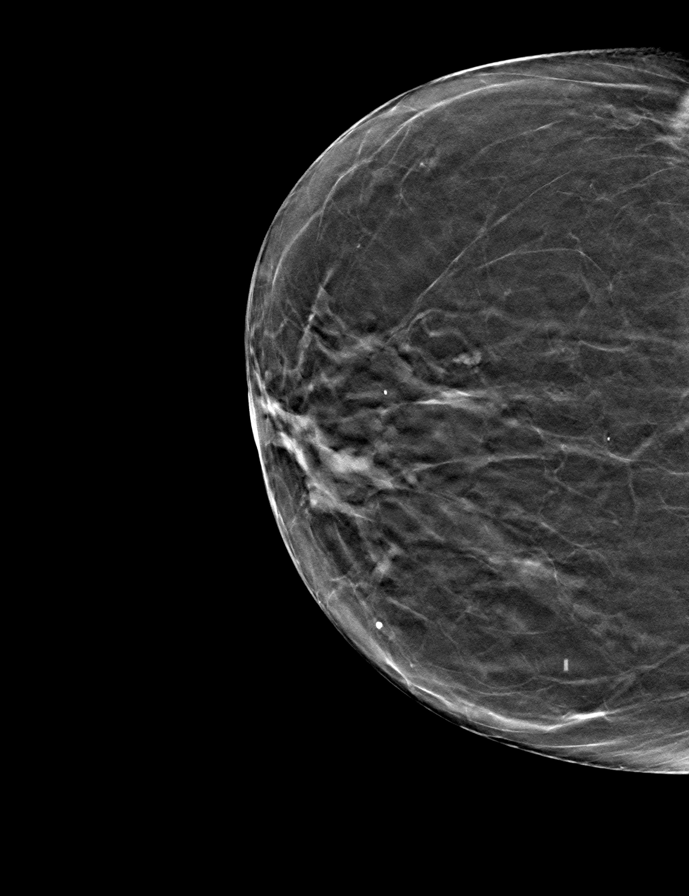

[R MLO tomo · tomo slice 27/52.0]
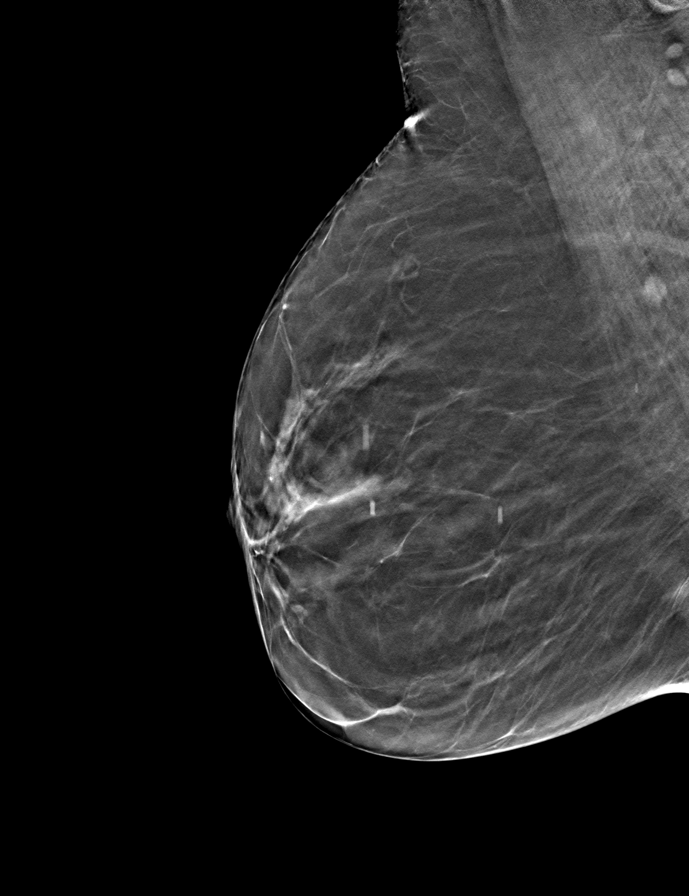

[9 of 25 positions shown; findings below may reference images not displayed]

ACR Breast Density Category b: There are scattered areas of
fibroglandular density.
FINDINGS: There are no findings suspicious for malignancy. Images were
processed with CAD.
IMPRESSION: No mammographic evidence of malignancy. A result letter of this
screening mammogram will be mailed directly to the patient.

RECOMMENDATION:
Screening mammogram in one year. (Code:55-L-23V)

BI-RADS CATEGORY  1: Negative.

## 2017-08-07 ENCOUNTER — Ambulatory Visit (INDEPENDENT_AMBULATORY_CARE_PROVIDER_SITE_OTHER): Payer: Medicare HMO | Admitting: Family Medicine

## 2017-08-07 ENCOUNTER — Encounter: Payer: Self-pay | Admitting: Family Medicine

## 2017-08-07 VITALS — BP 92/68 | HR 73 | Temp 98.0°F | Ht 59.0 in | Wt 107.4 lb

## 2017-08-07 DIAGNOSIS — S61211A Laceration without foreign body of left index finger without damage to nail, initial encounter: Secondary | ICD-10-CM | POA: Diagnosis not present

## 2017-08-07 DIAGNOSIS — Z23 Encounter for immunization: Secondary | ICD-10-CM

## 2017-08-07 NOTE — Progress Notes (Signed)
Patient: Tammy [name] MRN: [id number] DOB: February 05, 1950 PCP: Eulas Post, MD     Subjective:  Chief Complaint  Patient presents with  . laceration to left index finger    HPI: The patient is a 67 y.o. female who presents today for laceration of her left index finger. This happened last night.  She was opening up a bottle of wine and cut her finger on the cap of the wine that was some kind of metal. It bled a little. She put a band aid on it. Is way over due for her tetanus shot. It does not hurt her at all.   Review of Systems  Constitutional: Negative for chills and fever.  Respiratory: Negative for shortness of breath.   Cardiovascular: Negative for chest pain.  Musculoskeletal: Negative for arthralgias and joint swelling.  Skin: Positive for wound.    Allergies Patient has No Known Allergies.  Past Medical History Patient  has a past medical history of Allergic rhinitis, Anxiety, Depression, History of colonic polyps, Hyperlipemia, Hypothyroidism, and Osteoporosis.  Surgical History Patient  has a past surgical history that includes Cesarean section (1988); Appendectomy; Lobectomy (1989); Polypectomy (1998 only); Colonoscopy (2637,8588,5027,7412); and Breast biopsy (Right, 1989).  Family History Pateint's family history includes Breast cancer in her maternal aunt; Cancer in her father; Colon cancer in her cousin, maternal grandfather, and paternal uncle; Gout in her father; Hyperlipidemia in her father and unknown relative; Hypothyroidism in her mother; Osteoporosis in her mother.  Social History Patient  reports that she has never smoked. She has never used smokeless tobacco. She reports that she does not drink alcohol or use drugs.    Objective: Vitals:   08/07/17 1425  BP: 92/68  Pulse: 73  Temp: 98 F (36.7 C)  TempSrc: Oral  SpO2: 96%  Weight: 107 lb 6.4 oz (48.7 kg)  Height: 4\' 11"  (1.499 m)    Body mass index is 21.69 kg/m.  Physical Exam   Constitutional: She appears well-developed and well-nourished.  Skin: Skin is warm and dry.  Very superficial cut on her left index finger on, lateral/distal side. Already adhering back together. No erythema, edema, tenderness.   Vitals reviewed.      Assessment/plan: 1. Laceration of left index finger, foreign body presence unspecified, nail damage status unspecified, initial encounter Very superficial cut. Already healing back. No erythema, drainage, edema, pain. Wash good with soap and water and would just leave open since already closed. If she is going to put anything on it, recommended polysporin. Tetanus shot today. F/u as needed.  - Tdap vaccine greater than or equal to 7yo IM      Return if symptoms worsen or fail to improve.     Orma Flaming, MD  [place]  08/07/2017

## 2017-08-07 NOTE — Patient Instructions (Signed)

## 2017-08-11 ENCOUNTER — Other Ambulatory Visit: Payer: Self-pay | Admitting: Family Medicine

## 2017-08-22 ENCOUNTER — Encounter: Payer: Self-pay | Admitting: Family Medicine

## 2017-09-01 ENCOUNTER — Other Ambulatory Visit: Payer: Self-pay | Admitting: Family Medicine

## 2017-10-16 ENCOUNTER — Other Ambulatory Visit: Payer: Self-pay | Admitting: Family Medicine

## 2017-10-17 DIAGNOSIS — H0231 Blepharochalasis right upper eyelid: Secondary | ICD-10-CM | POA: Diagnosis not present

## 2017-10-21 ENCOUNTER — Encounter: Payer: Medicare HMO | Admitting: Family Medicine

## 2017-10-21 ENCOUNTER — Other Ambulatory Visit: Payer: Self-pay | Admitting: Family Medicine

## 2017-10-30 ENCOUNTER — Ambulatory Visit: Payer: Commercial Managed Care - HMO

## 2017-10-31 ENCOUNTER — Ambulatory Visit: Payer: Medicare HMO

## 2017-11-03 ENCOUNTER — Encounter: Payer: Self-pay | Admitting: Family Medicine

## 2017-11-03 ENCOUNTER — Other Ambulatory Visit: Payer: Self-pay

## 2017-11-03 ENCOUNTER — Ambulatory Visit (INDEPENDENT_AMBULATORY_CARE_PROVIDER_SITE_OTHER): Payer: Medicare HMO | Admitting: Family Medicine

## 2017-11-03 VITALS — BP 104/64 | HR 70 | Temp 98.4°F | Ht 59.0 in | Wt 104.9 lb

## 2017-11-03 DIAGNOSIS — Z23 Encounter for immunization: Secondary | ICD-10-CM

## 2017-11-03 DIAGNOSIS — Z79899 Other long term (current) drug therapy: Secondary | ICD-10-CM | POA: Diagnosis not present

## 2017-11-03 DIAGNOSIS — Z0000 Encounter for general adult medical examination without abnormal findings: Secondary | ICD-10-CM

## 2017-11-03 DIAGNOSIS — M810 Age-related osteoporosis without current pathological fracture: Secondary | ICD-10-CM

## 2017-11-03 LAB — BASIC METABOLIC PANEL
BUN: 17 mg/dL (ref 6–23)
CO2: 32 meq/L (ref 19–32)
Calcium: 9.3 mg/dL (ref 8.4–10.5)
Chloride: 102 mEq/L (ref 96–112)
Creatinine, Ser: 0.74 mg/dL (ref 0.40–1.20)
GFR: 82.7 mL/min (ref >60.00)
GLUCOSE: 85 mg/dL (ref 70–99)
Potassium: 3.9 mEq/L (ref 3.5–5.1)
SODIUM: 142 meq/L (ref 135–145)

## 2017-11-03 LAB — CBC WITH DIFFERENTIAL/PLATELET
BASOS ABS: 0 10*3/uL (ref 0.0–0.1)
BASOS PCT: 0.3 % (ref 0.0–3.0)
EOS ABS: 0.1 10*3/uL (ref 0.0–0.7)
Eosinophils Relative: 2 % (ref 0.0–5.0)
HCT: 37 % (ref 36.0–46.0)
HEMOGLOBIN: 12.5 g/dL (ref 12.0–15.0)
LYMPHS PCT: 51.2 % — AB (ref 12.0–46.0)
Lymphs Abs: 2.1 10*3/uL (ref 0.7–4.0)
MCHC: 33.8 g/dL (ref 30.0–36.0)
MCV: 92.1 fl (ref 78.0–100.0)
MONOS PCT: 8.5 % (ref 3.0–12.0)
Monocytes Absolute: 0.3 10*3/uL (ref 0.1–1.0)
NEUTROS ABS: 1.5 10*3/uL (ref 1.4–7.7)
Neutrophils Relative %: 38 % — ABNORMAL LOW (ref 43.0–77.0)
Platelets: 165 10*3/uL (ref 150.0–400.0)
RBC: 4.02 Mil/uL (ref 3.87–5.11)
RDW: 14.7 % (ref 11.5–15.5)
WBC: 4 10*3/uL (ref 4.0–10.5)

## 2017-11-03 LAB — LIPID PANEL
Cholesterol: 172 mg/dL (ref 0–200)
HDL: 57.1 mg/dL (ref >39.00)
LDL CALC: 95 mg/dL (ref 0–99)
NonHDL: 114.49
Total CHOL/HDL Ratio: 3
Triglycerides: 95 mg/dL (ref 0.0–149.0)
VLDL: 19 mg/dL (ref 0.0–40.0)

## 2017-11-03 LAB — HEPATIC FUNCTION PANEL
ALT: 20 U/L (ref 0–35)
AST: 27 U/L (ref 0–37)
Albumin: 4.3 g/dL (ref 3.5–5.2)
Alkaline Phosphatase: 47 U/L (ref 39–117)
Bilirubin, Direct: 0.1 mg/dL (ref 0.0–0.3)
TOTAL PROTEIN: 6.8 g/dL (ref 6.0–8.3)
Total Bilirubin: 0.5 mg/dL (ref 0.2–1.2)

## 2017-11-03 LAB — TSH: TSH: 0.73 u[IU]/mL (ref 0.35–4.50)

## 2017-11-03 LAB — VITAMIN D 25 HYDROXY (VIT D DEFICIENCY, FRACTURES): VITD: 66.44 ng/mL (ref 30.00–100.00)

## 2017-11-03 NOTE — Progress Notes (Signed)
Subjective:     Patient ID: Tammy [name], female   DOB: December 20, 1950, 67 y.o.   MRN: [id number]  HPI Patient is here for physical exam.  She has history of hypothyroidism, hyperlipidemia, bipolar disorder, osteoporosis.  She is followed by psychiatry.  She is on Depakote and requesting Depakote levels.  She had bone density scan July 2017 with osteoporosis.  She was started on Fosamax at that time and tolerating well no side effects.  Appetite and weight are stable.  No recent falls.  Needs flu vaccine.  Otherwise, immunizations up-to-date.  Family history reviewed.  Mother is alive age 85 doing well.  Father died age 24 complications of pancreatic cancer.  Past Medical History:  Diagnosis Date  . Allergic rhinitis   . Anxiety   . Depression   . History of colonic polyps   . Hyperlipemia   . Hypothyroidism   . Osteoporosis    Past Surgical History:  Procedure Laterality Date  . APPENDECTOMY    . BREAST BIOPSY Right 1989   benign  . CESAREAN SECTION  1988  . COLONOSCOPY  1998,2002,2007,2012  . LOBECTOMY  1989  . POLYPECTOMY  1998 only   hyperplastic    reports that she has never smoked. She has never used smokeless tobacco. She reports that she does not drink alcohol or use drugs. family history includes Breast cancer in her maternal aunt; Cancer in her father; Colon cancer in her cousin, maternal grandfather, and paternal uncle; Gout in her father; Hyperlipidemia in her father and unknown relative; Hypothyroidism in her mother; Osteoporosis in her mother. No Known Allergies   Review of Systems  Constitutional: Negative for activity change, appetite change, fatigue, fever and unexpected weight change.  HENT: Negative for ear pain, hearing loss, sore throat and trouble swallowing.   Eyes: Negative for visual disturbance.  Respiratory: Negative for cough and shortness of breath.   Cardiovascular: Negative for chest pain and palpitations.  Gastrointestinal: Negative for abdominal  pain, blood in stool, constipation and diarrhea.  Genitourinary: Negative for dysuria and hematuria.  Musculoskeletal: Negative for arthralgias, back pain and myalgias.  Skin: Negative for rash.  Neurological: Negative for dizziness, syncope and headaches.  Hematological: Negative for adenopathy.  Psychiatric/Behavioral: Negative for confusion and dysphoric mood.       Objective:   Physical Exam  Constitutional: She is oriented to person, place, and time. She appears well-developed and well-nourished.  HENT:  Head: Normocephalic and atraumatic.  Eyes: Pupils are equal, round, and reactive to light. EOM are normal.  Neck: Normal range of motion. Neck supple. No thyromegaly present.  Cardiovascular: Normal rate, regular rhythm and normal heart sounds.  No murmur heard. Pulmonary/Chest: Breath sounds normal. No respiratory distress. She has no wheezes. She has no rales.  Abdominal: Soft. Bowel sounds are normal. She exhibits no distension and no mass. There is no tenderness. There is no rebound and no guarding.  Musculoskeletal: Normal range of motion. She exhibits no edema.  Lymphadenopathy:    She has no cervical adenopathy.  Neurological: She is alert and oriented to person, place, and time. She displays normal reflexes. No cranial nerve deficit.  Skin: No rash noted.  Psychiatric: She has a normal mood and affect. Her behavior is normal. Judgment and thought content normal.       Assessment:     Physical exam.  Several health maintenance issues addressed as below.    Plan:     -Repeat DEXA scan with history of osteoporosis and last  DEXA scan over 2 years ago -Flu vaccine given -Obtain lab work with lipid panel, hepatic panel, CBC, TSH, valproic acid level, basic metabolic panel, [id number] vitamin D level -Continue regular weightbearing exercise and daily calcium and vitamin D - she is getting every other year mammograms  Eulas Post MD [facility] at  [facility]

## 2017-11-03 NOTE — Patient Instructions (Signed)
Set up bone density scan at [facility] clinic.

## 2017-11-04 ENCOUNTER — Other Ambulatory Visit: Payer: Self-pay | Admitting: Family Medicine

## 2017-11-04 LAB — VALPROIC ACID LEVEL: VALPROIC ACID LVL: 72 mg/L (ref 50.0–100.0)

## 2017-11-06 ENCOUNTER — Ambulatory Visit (INDEPENDENT_AMBULATORY_CARE_PROVIDER_SITE_OTHER)
Admission: RE | Admit: 2017-11-06 | Discharge: 2017-11-06 | Disposition: A | Discharge status: Home / Self Care | Payer: Medicare HMO | Source: Ambulatory Visit | Attending: Family Medicine | Admitting: Family Medicine

## 2017-11-06 DIAGNOSIS — M810 Age-related osteoporosis without current pathological fracture: Secondary | ICD-10-CM

## 2017-11-07 ENCOUNTER — Other Ambulatory Visit: Payer: Self-pay | Admitting: Family Medicine

## 2017-11-09 ENCOUNTER — Encounter: Payer: Self-pay | Admitting: Family Medicine

## 2017-12-01 DIAGNOSIS — F3130 Bipolar disorder, current episode depressed, mild or moderate severity, unspecified: Secondary | ICD-10-CM | POA: Diagnosis not present

## 2017-12-29 ENCOUNTER — Other Ambulatory Visit: Payer: Self-pay | Admitting: Family Medicine

## 2018-01-02 ENCOUNTER — Other Ambulatory Visit: Payer: Self-pay

## 2018-01-23 DIAGNOSIS — H02811 Retained foreign body in right upper eyelid: Secondary | ICD-10-CM | POA: Diagnosis not present

## 2018-02-03 DIAGNOSIS — H26493 Other secondary cataract, bilateral: Secondary | ICD-10-CM | POA: Diagnosis not present

## 2018-02-03 DIAGNOSIS — Z961 Presence of intraocular lens: Secondary | ICD-10-CM | POA: Diagnosis not present

## 2018-02-03 DIAGNOSIS — H18413 Arcus senilis, bilateral: Secondary | ICD-10-CM | POA: Diagnosis not present

## 2018-02-03 DIAGNOSIS — H02831 Dermatochalasis of right upper eyelid: Secondary | ICD-10-CM | POA: Diagnosis not present

## 2018-02-03 DIAGNOSIS — H26491 Other secondary cataract, right eye: Secondary | ICD-10-CM | POA: Diagnosis not present

## 2018-02-17 DIAGNOSIS — H26492 Other secondary cataract, left eye: Secondary | ICD-10-CM | POA: Diagnosis not present

## 2018-02-17 DIAGNOSIS — Z961 Presence of intraocular lens: Secondary | ICD-10-CM | POA: Diagnosis not present

## 2018-02-17 DIAGNOSIS — H35341 Macular cyst, hole, or pseudohole, right eye: Secondary | ICD-10-CM | POA: Diagnosis not present

## 2018-03-01 ENCOUNTER — Encounter: Payer: Self-pay | Admitting: Family Medicine

## 2018-03-03 ENCOUNTER — Ambulatory Visit
Admission: RE | Admit: 2018-03-03 | Discharge: 2018-03-03 | Disposition: A | Discharge status: Home / Self Care | Payer: Medicare HMO | Source: Ambulatory Visit

## 2018-03-03 ENCOUNTER — Other Ambulatory Visit: Payer: Self-pay | Admitting: Family Medicine

## 2018-03-03 DIAGNOSIS — Z1231 Encounter for screening mammogram for malignant neoplasm of breast: Secondary | ICD-10-CM

## 2018-03-30 DIAGNOSIS — F3130 Bipolar disorder, current episode depressed, mild or moderate severity, unspecified: Secondary | ICD-10-CM | POA: Diagnosis not present

## 2018-05-13 ENCOUNTER — Other Ambulatory Visit: Payer: Self-pay | Admitting: Family Medicine

## 2018-05-20 DIAGNOSIS — H43393 Other vitreous opacities, bilateral: Secondary | ICD-10-CM | POA: Diagnosis not present

## 2018-05-20 DIAGNOSIS — H35371 Puckering of macula, right eye: Secondary | ICD-10-CM | POA: Diagnosis not present

## 2018-05-20 DIAGNOSIS — H43823 Vitreomacular adhesion, bilateral: Secondary | ICD-10-CM | POA: Diagnosis not present

## 2018-05-28 DIAGNOSIS — F3130 Bipolar disorder, current episode depressed, mild or moderate severity, unspecified: Secondary | ICD-10-CM | POA: Diagnosis not present

## 2018-06-17 ENCOUNTER — Other Ambulatory Visit: Payer: Self-pay | Admitting: Family Medicine

## 2018-06-23 NOTE — Telephone Encounter (Signed)
Pt called in to check the status of this refill.   Should be sent to [facility].    Fax number if needed [phone]

## 2018-08-12 ENCOUNTER — Other Ambulatory Visit: Payer: Self-pay | Admitting: Family Medicine

## 2018-09-17 ENCOUNTER — Ambulatory Visit (INDEPENDENT_AMBULATORY_CARE_PROVIDER_SITE_OTHER): Payer: Medicare HMO

## 2018-09-17 ENCOUNTER — Other Ambulatory Visit: Payer: Self-pay

## 2018-09-17 DIAGNOSIS — Z23 Encounter for immunization: Secondary | ICD-10-CM

## 2018-09-25 ENCOUNTER — Encounter: Payer: Self-pay | Admitting: Family Medicine

## 2018-10-08 ENCOUNTER — Other Ambulatory Visit: Payer: Self-pay | Admitting: Family Medicine

## 2018-10-29 DIAGNOSIS — F3130 Bipolar disorder, current episode depressed, mild or moderate severity, unspecified: Secondary | ICD-10-CM | POA: Diagnosis not present

## 2018-10-31 ENCOUNTER — Encounter: Payer: Self-pay | Admitting: Family Medicine

## 2018-11-09 ENCOUNTER — Other Ambulatory Visit: Payer: Self-pay | Admitting: Family Medicine

## 2018-11-11 NOTE — Telephone Encounter (Signed)
Refill for one month until follow up. 

## 2018-11-11 NOTE — Telephone Encounter (Signed)
Please advise pt, does have a pending appt on 11/24/18. Did you want me to send enough to get her to her appt

## 2018-11-24 ENCOUNTER — Other Ambulatory Visit: Payer: Self-pay

## 2018-11-24 ENCOUNTER — Encounter: Payer: Self-pay | Admitting: Family Medicine

## 2018-11-24 ENCOUNTER — Ambulatory Visit (INDEPENDENT_AMBULATORY_CARE_PROVIDER_SITE_OTHER): Payer: Medicare HMO | Admitting: Family Medicine

## 2018-11-24 VITALS — BP 108/62 | HR 84 | Temp 98.3°F | Resp 16 | Ht 59.0 in | Wt 104.3 lb

## 2018-11-24 DIAGNOSIS — E039 Hypothyroidism, unspecified: Secondary | ICD-10-CM

## 2018-11-24 DIAGNOSIS — Z0000 Encounter for general adult medical examination without abnormal findings: Secondary | ICD-10-CM | POA: Diagnosis not present

## 2018-11-24 DIAGNOSIS — Z79899 Other long term (current) drug therapy: Secondary | ICD-10-CM | POA: Diagnosis not present

## 2018-11-24 DIAGNOSIS — E785 Hyperlipidemia, unspecified: Secondary | ICD-10-CM

## 2018-11-24 LAB — LIPID PANEL
Cholesterol: 179 mg/dL (ref 0–200)
HDL: 58.8 mg/dL (ref >39.00)
LDL Cholesterol: 101 mg/dL — ABNORMAL HIGH (ref 0–99)
NonHDL: 120.64
Total CHOL/HDL Ratio: 3
Triglycerides: 100 mg/dL (ref 0.0–149.0)
VLDL: 20 mg/dL (ref 0.0–40.0)

## 2018-11-24 LAB — CBC WITH DIFFERENTIAL/PLATELET
Basophils Absolute: 0 10*3/uL (ref 0.0–0.1)
Basophils Relative: 0.4 % (ref 0.0–3.0)
Eosinophils Absolute: 0.1 10*3/uL (ref 0.0–0.7)
Eosinophils Relative: 1.6 % (ref 0.0–5.0)
HCT: 37 % (ref 36.0–46.0)
Hemoglobin: 12.4 g/dL (ref 12.0–15.0)
Lymphocytes Relative: 51.4 % — ABNORMAL HIGH (ref 12.0–46.0)
Lymphs Abs: 2.2 10*3/uL (ref 0.7–4.0)
MCHC: 33.4 g/dL (ref 30.0–36.0)
MCV: 91.8 fl (ref 78.0–100.0)
Monocytes Absolute: 0.3 10*3/uL (ref 0.1–1.0)
Monocytes Relative: 7.6 % (ref 3.0–12.0)
Neutro Abs: 1.7 10*3/uL (ref 1.4–7.7)
Neutrophils Relative %: 39 % — ABNORMAL LOW (ref 43.0–77.0)
Platelets: 167 10*3/uL (ref 150.0–400.0)
RBC: 4.03 Mil/uL (ref 3.87–5.11)
RDW: 14.6 % (ref 11.5–15.5)
WBC: 4.3 10*3/uL (ref 4.0–10.5)

## 2018-11-24 LAB — BASIC METABOLIC PANEL
BUN: 22 mg/dL (ref 6–23)
CO2: 30 mEq/L (ref 19–32)
Calcium: 9.4 mg/dL (ref 8.4–10.5)
Chloride: 103 mEq/L (ref 96–112)
Creatinine, Ser: 0.71 mg/dL (ref 0.40–1.20)
GFR: 81.37 mL/min (ref >60.00)
Glucose, Bld: 83 mg/dL (ref 70–99)
Potassium: 3.8 mEq/L (ref 3.5–5.1)
Sodium: 140 mEq/L (ref 135–145)

## 2018-11-24 LAB — HEPATIC FUNCTION PANEL
ALT: 12 U/L (ref 0–35)
AST: 22 U/L (ref 0–37)
Albumin: 4.3 g/dL (ref 3.5–5.2)
Alkaline Phosphatase: 51 U/L (ref 39–117)
Bilirubin, Direct: 0.1 mg/dL (ref 0.0–0.3)
Total Bilirubin: 0.5 mg/dL (ref 0.2–1.2)
Total Protein: 6.8 g/dL (ref 6.0–8.3)

## 2018-11-24 LAB — TSH: TSH: 1.3 u[IU]/mL (ref 0.35–4.50)

## 2018-11-24 NOTE — Progress Notes (Signed)
Subjective:     Patient ID: Tammy [name], female   DOB: September 08, 1950, 68 y.o.   MRN: [id number]  HPI Tammy [name] is here for physical exam and medical follow-up.  She has chronic problems of osteoporosis, hypothyroidism, hyperlipidemia, bipolar disorder.  She is followed by psychiatry for her bipolar.  She is on Depakote and requesting levels.  Compliant with medications.  Generally doing well.  She helps take care of her 65 year old mother who lives with her  Health maintenance reviewed.  She had previous Zostavax 2016 but no history of Shingrix.  Other vaccines and health maintenance up-to-date.  No recent falls.  No formal exercise.  Reviewed with no changes.: Past Medical History:  Diagnosis Date  . Allergic rhinitis   . Anxiety   . Depression   . History of colonic polyps   . Hyperlipemia   . Hypothyroidism   . Osteoporosis    Past Surgical History:  Procedure Laterality Date  . APPENDECTOMY    . BREAST BIOPSY Right 1989   benign  . CESAREAN SECTION  1988  . COLONOSCOPY  1998,2002,2007,2012  . LOBECTOMY  1989  . POLYPECTOMY  1998 only   hyperplastic    reports that she has never smoked. She has never used smokeless tobacco. She reports that she does not drink alcohol or use drugs. family history includes Breast cancer in her maternal aunt; Cancer in her father; Colon cancer in her cousin, maternal grandfather, and paternal uncle; Gout in her father; Hyperlipidemia in her father and another family member; Hypothyroidism in her mother; Osteoporosis in her mother. No Known Allergies   Review of Systems  Constitutional: Negative for activity change, appetite change, fatigue, fever and unexpected weight change.  HENT: Negative for ear pain, hearing loss, sore throat and trouble swallowing.   Eyes: Negative for visual disturbance.  Respiratory: Negative for cough and shortness of breath.   Cardiovascular: Negative for chest pain and palpitations.  Gastrointestinal: Negative  for abdominal pain, blood in stool, constipation and diarrhea.  Genitourinary: Negative for dysuria and hematuria.  Musculoskeletal: Negative for arthralgias, back pain and myalgias.  Skin: Negative for rash.  Neurological: Negative for dizziness, syncope and headaches.  Hematological: Negative for adenopathy.  Psychiatric/Behavioral: Negative for confusion and dysphoric mood.       Objective:   Physical Exam Constitutional:      Appearance: She is well-developed.  HENT:     Head: Normocephalic and atraumatic.  Eyes:     Pupils: Pupils are equal, round, and reactive to light.  Neck:     Musculoskeletal: Normal range of motion and neck supple.     Thyroid: No thyromegaly.  Cardiovascular:     Rate and Rhythm: Normal rate and regular rhythm.     Heart sounds: Normal heart sounds. No murmur.  Pulmonary:     Effort: No respiratory distress.     Breath sounds: Normal breath sounds. No wheezing or rales.  Abdominal:     General: Bowel sounds are normal. There is no distension.     Palpations: Abdomen is soft. There is no mass.     Tenderness: There is no abdominal tenderness. There is no guarding or rebound.  Musculoskeletal: Normal range of motion.  Lymphadenopathy:     Cervical: No cervical adenopathy.  Skin:    Findings: No rash.  Neurological:     Mental Status: She is alert and oriented to person, place, and time.     Cranial Nerves: No cranial nerve deficit.  Deep Tendon Reflexes: Reflexes normal.  Psychiatric:        Behavior: Behavior normal.        Thought Content: Thought content normal.        Judgment: Judgment normal.        Assessment:     #1 physical exam.  Several health maintenance issues discussed as below  #2 hypothyroidism  #3 hyperlipidemia treated with simvastatin    Plan:     -Check labs including TSH, lipid panel, hepatic panel, CBC, basic metabolic panel, and valproic acid level -Flu vaccine already given -Discussed Shingrix vaccine  and she will check at pharmacy regarding that -Consider repeat DEXA scan by next year.  Her last DEXA was 2019  [facility] at [facility]

## 2018-11-24 NOTE — Patient Instructions (Signed)
Zoster Vaccine, Recombinant injection What is this medicine? ZOSTER VACCINE (ZOS ter vak SEEN) is used to prevent shingles in adults 70 years old and over. This vaccine is not used to treat shingles or nerve pain from shingles. This medicine may be used for other purposes; ask your health care provider or pharmacist if you have questions. COMMON BRAND NAME(S): SHINGRIX What should I tell my health care provider before I take this medicine? They need to know if you have any of these conditions:  blood disorders or disease  cancer like leukemia or lymphoma  immune system problems or therapy  an unusual or allergic reaction to vaccines, other medications, foods, dyes, or preservatives  pregnant or trying to get pregnant  breast-feeding How should I use this medicine? This vaccine is for injection in a muscle. It is given by a health care professional. Talk to your pediatrician regarding the use of this medicine in children. This medicine is not approved for use in children. Overdosage: If you think you have taken too much of this medicine contact a poison control center or emergency room at once. NOTE: This medicine is only for you. Do not share this medicine with others. What if I miss a dose? Keep appointments for follow-up (booster) doses as directed. It is important not to miss your dose. Call your doctor or health care professional if you are unable to keep an appointment. What may interact with this medicine?  medicines that suppress your immune system  medicines to treat cancer  steroid medicines like prednisone or cortisone This list may not describe all possible interactions. Give your health care provider a list of all the medicines, herbs, non-prescription drugs, or dietary supplements you use. Also tell them if you smoke, drink alcohol, or use illegal drugs. Some items may interact with your medicine. What should I watch for while using this medicine? Visit your doctor for  regular check ups. This vaccine, like all vaccines, may not fully protect everyone. What side effects may I notice from receiving this medicine? Side effects that you should report to your doctor or health care professional as soon as possible:  allergic reactions like skin rash, itching or hives, swelling of the face, lips, or tongue  breathing problems Side effects that usually do not require medical attention (report these to your doctor or health care professional if they continue or are bothersome):  chills  headache  fever  nausea, vomiting  redness, warmth, pain, swelling or itching at site where injected  tiredness This list may not describe all possible side effects. Call your doctor for medical advice about side effects. You may report side effects to FDA at 1-800-FDA-1088. Where should I keep my medicine? This vaccine is only given in a clinic, pharmacy, doctor's office, or other health care setting and will not be stored at home. NOTE: This sheet is a summary. It may not cover all possible information. If you have questions about this medicine, talk to your doctor, pharmacist, or health care provider.  2020 Elsevier/Gold Standard (2016-08-12 13:20:30)  

## 2018-11-24 NOTE — Telephone Encounter (Signed)
Attempted to reach pt, ask Apolonio Schneiders and she states in order to have in chart we need a copy. Pt did not answer. Will try to call back

## 2018-11-25 LAB — VALPROIC ACID LEVEL: Valproic Acid Lvl: 96.8 mg/L (ref 50.0–100.0)

## 2018-11-28 ENCOUNTER — Encounter: Payer: Self-pay | Admitting: Family Medicine

## 2018-12-14 DIAGNOSIS — F3130 Bipolar disorder, current episode depressed, mild or moderate severity, unspecified: Secondary | ICD-10-CM | POA: Diagnosis not present

## 2019-01-17 ENCOUNTER — Encounter: Payer: Self-pay | Admitting: Family Medicine

## 2019-01-18 ENCOUNTER — Encounter: Payer: Self-pay | Admitting: Family Medicine

## 2019-04-05 ENCOUNTER — Telehealth: Payer: Self-pay | Admitting: Family Medicine

## 2019-04-05 MED ORDER — SIMVASTATIN 20 MG PO TABS: 20.0000 mg | ORAL_TABLET | Freq: Every day | ORAL | 0 refills | Status: DC

## 2019-04-05 NOTE — Telephone Encounter (Signed)
Pt called to follow up on if prescription could be sent. Advised waiting for approval from provider.

## 2019-04-05 NOTE — Telephone Encounter (Signed)
Medication Refill: Simvastain  Pharmacy: [facility]  Phone:    Pt is requesting a small prescription to be sent to local pharmacy while she waits for mail order to be delivered

## 2019-04-05 NOTE — Telephone Encounter (Signed)
Please advise if okay?

## 2019-04-05 NOTE — Telephone Encounter (Signed)
PT has been notified that this has been sent in

## 2019-04-05 NOTE — Telephone Encounter (Signed)
Refill for 6 months. 

## 2019-04-06 ENCOUNTER — Other Ambulatory Visit: Payer: Self-pay | Admitting: Family Medicine

## 2019-04-13 DIAGNOSIS — F3130 Bipolar disorder, current episode depressed, mild or moderate severity, unspecified: Secondary | ICD-10-CM | POA: Diagnosis not present

## 2019-04-14 ENCOUNTER — Other Ambulatory Visit: Payer: Self-pay | Admitting: Family Medicine

## 2019-05-03 ENCOUNTER — Other Ambulatory Visit: Payer: Self-pay | Admitting: Family Medicine

## 2019-06-20 ENCOUNTER — Other Ambulatory Visit: Payer: Self-pay | Admitting: Family Medicine

## 2019-07-27 ENCOUNTER — Other Ambulatory Visit: Payer: Self-pay | Admitting: Family Medicine

## 2019-07-27 DIAGNOSIS — Z1231 Encounter for screening mammogram for malignant neoplasm of breast: Secondary | ICD-10-CM

## 2019-08-04 ENCOUNTER — Ambulatory Visit (INDEPENDENT_AMBULATORY_CARE_PROVIDER_SITE_OTHER): Payer: Medicare HMO | Admitting: Family Medicine

## 2019-08-04 ENCOUNTER — Encounter: Payer: Self-pay | Admitting: Family Medicine

## 2019-08-04 ENCOUNTER — Other Ambulatory Visit: Payer: Self-pay

## 2019-08-04 VITALS — BP 122/66 | HR 85 | Temp 98.2°F | Wt 101.4 lb

## 2019-08-04 DIAGNOSIS — E039 Hypothyroidism, unspecified: Secondary | ICD-10-CM | POA: Diagnosis not present

## 2019-08-04 DIAGNOSIS — M810 Age-related osteoporosis without current pathological fracture: Secondary | ICD-10-CM

## 2019-08-04 DIAGNOSIS — E785 Hyperlipidemia, unspecified: Secondary | ICD-10-CM

## 2019-08-04 NOTE — Progress Notes (Signed)
Established Patient Office Visit  Subjective:  Patient ID: Tammy [name], female    DOB: 1950-02-15  Age: 69 y.o. MRN: [id number]  CC:  Chief Complaint  Patient presents with  . Annual Exam    pt has no new concerns     HPI Tammy [name] presents for medical follow-up.  She was initially scheduled for physical but actually had her physical back in November and did not realize this had not been a year.  Her chronic problems include history of hypothyroidism, osteoporosis, bipolar disorder, hyperlipidemia.  She is followed by psychiatry for her bipolar.  She feels that is stable.  She is on combination therapy with sertraline and Depakote.  She takes simvastatin for hyperlipidemia.  Lipids from last fall reviewed.  Denies any myalgias.  Compliant with therapy.  She is on levothyroxine for hypothyroidism.  Denies any cold intolerance, constipation, major fatigue issues  She has osteoporosis.  Her last DEXA scan was 10/19.  T score -2.5.  She has been on Fosamax since 2017.  No recent falls.  No recent fractures.  She does take daily calcium and vitamin D.  She apparently is on baby aspirin but does not have clear indication.  No history of CVA or CAD.  Her major stress is taking care of her 27 year old mother.  Her mother lives through [place] and has some significant chronic anxiety issues related to that.  Past Medical History:  Diagnosis Date  . Allergic rhinitis   . Anxiety   . Depression   . History of colonic polyps   . Hyperlipemia   . Hypothyroidism   . Osteoporosis     Past Surgical History:  Procedure Laterality Date  . APPENDECTOMY    . BREAST BIOPSY Right 1989   benign  . CESAREAN SECTION  1988  . COLONOSCOPY  1998,2002,2007,2012  . LOBECTOMY  1989  . POLYPECTOMY  1998 only   hyperplastic    Family History  Problem Relation Age of Onset  . Gout Father   . Hyperlipidemia Father   . Cancer Father        Pancreatic Tumor  . Osteoporosis  Mother   . Hypothyroidism Mother   . Hyperlipidemia Other   . Colon cancer Paternal Uncle   . Colon cancer Maternal Grandfather   . Colon cancer Cousin   . Breast cancer Maternal Aunt     Social History   Socioeconomic History  . Marital status: Divorced    Spouse name: Not on file  . Number of children: Not on file  . Years of education: Not on file  . Highest education level: Not on file  Occupational History  . Occupation: unemployed  Tobacco Use  . Smoking status: Never Smoker  . Smokeless tobacco: Never Used  Vaping Use  . Vaping Use: Never used  Substance and Sexual Activity  . Alcohol use: No    Alcohol/week: 0.0 standard drinks  . Drug use: No  . Sexual activity: Not on file  Other Topics Concern  . Not on file  Social History Narrative   Single, presently unemployed without health insurance   Social Determinants of Health   Financial Resource Strain:   . Difficulty of Paying Living Expenses:   Food Insecurity:   . Worried About Charity fundraiser in the Last Year:   . Arboriculturist in the Last Year:   Transportation Needs:   . Film/video editor (Medical):   Marland Kitchen Lack of  Transportation (Non-Medical):   Physical Activity:   . Days of Exercise per Week:   . Minutes of Exercise per Session:   Stress:   . Feeling of Stress :   Social Connections:   . Frequency of Communication with Friends and Family:   . Frequency of Social Gatherings with Friends and Family:   . Attends Religious Services:   . Active Member of Clubs or Organizations:   . Attends Archivist Meetings:   Marland Kitchen Marital Status:   Intimate Partner Violence:   . Fear of Current or Ex-Partner:   . Emotionally Abused:   Marland Kitchen Physically Abused:   . Sexually Abused:     Outpatient Medications Prior to Visit  Medication Sig Dispense Refill  . alendronate (FOSAMAX) 70 MG tablet TAKE 1 TABLET EVERY WEEK AS DIRECTED. SEE PACKAGE FOR ADDITONAL INSTRUCTIONS. NEEDS BONE DENSITY TEST. 12  tablet 3  . aspirin 81 MG tablet Take 81 mg by mouth daily.      . Calcium Carbonate (CALCIUM 600 PO) Take by mouth. Takes one tablet daily.    . Calcium Citrate-Vitamin D (CITRACAL/VITAMIN D PO) Take by mouth. Takes 1 tablet daily. 500IU vitamin D and 630mg  calcium.    . divalproex (DEPAKOTE ER) 250 MG 24 hr tablet Take 250 mg by mouth. Three tabs daily    . levothyroxine (SYNTHROID) 88 MCG tablet Take 1 tablet (88 mcg total) by mouth daily. 90 tablet 1  . Multiple Vitamins-Minerals (WOMENS 50+ MULTI VITAMIN/MIN PO) Take by mouth daily.    . sertraline (ZOLOFT) 100 MG tablet Take 200 mg by mouth daily. Take 2 tablet bu mouth daily.    . simvastatin (ZOCOR) 20 MG tablet TAKE 1 TABLET AT BEDTIME 90 tablet 1   No facility-administered medications prior to visit.    No Known Allergies  ROS Review of Systems  Constitutional: Negative for fatigue.  Eyes: Negative for visual disturbance.  Respiratory: Negative for cough, chest tightness, shortness of breath and wheezing.   Cardiovascular: Negative for chest pain, palpitations and leg swelling.  Gastrointestinal: Negative for abdominal pain.  Endocrine: Negative for cold intolerance.  Genitourinary: Negative for dysuria.  Neurological: Negative for dizziness, seizures, syncope, weakness, light-headedness and headaches.  Psychiatric/Behavioral: Negative for dysphoric mood.      Objective:    Physical Exam Vitals reviewed.  Constitutional:      Appearance: Normal appearance.  Cardiovascular:     Rate and Rhythm: Normal rate and regular rhythm.  Pulmonary:     Effort: Pulmonary effort is normal.     Breath sounds: Normal breath sounds.  Abdominal:     Palpations: Abdomen is soft. There is no mass.     Tenderness: There is no abdominal tenderness.  Musculoskeletal:     Right lower leg: No edema.     Left lower leg: No edema.  Neurological:     Mental Status: She is alert.     BP 122/66 (BP Location: Left Arm, Patient Position:  Sitting, Cuff Size: Normal)   Pulse 85   Temp 98.2 F (36.8 C) (Oral)   Wt 101 lb 6.4 oz (46 kg)   SpO2 96%   BMI 20.48 kg/m  Wt Readings from Last 3 Encounters:  08/04/19 101 lb 6.4 oz (46 kg)  11/24/18 104 lb 4.8 oz (47.3 kg)  11/03/17 104 lb 14.4 oz (47.6 kg)     There are no preventive care reminders to display for this patient.  There are no preventive care reminders to display  for this patient.  Lab Results  Component Value Date   TSH 1.30 11/24/2018   Lab Results  Component Value Date   WBC 4.3 11/24/2018   HGB 12.4 11/24/2018   HCT 37.0 11/24/2018   MCV 91.8 11/24/2018   PLT 167.0 11/24/2018   Lab Results  Component Value Date   NA 140 11/24/2018   K 3.8 11/24/2018   CO2 30 11/24/2018   GLUCOSE 83 11/24/2018   BUN 22 11/24/2018   CREATININE 0.71 11/24/2018   BILITOT 0.5 11/24/2018   ALKPHOS 51 11/24/2018   AST 22 11/24/2018   ALT 12 11/24/2018   PROT 6.8 11/24/2018   ALBUMIN 4.3 11/24/2018   CALCIUM 9.4 11/24/2018   GFR 81.37 11/24/2018   Lab Results  Component Value Date   CHOL 179 11/24/2018   Lab Results  Component Value Date   HDL 58.80 11/24/2018   Lab Results  Component Value Date   LDLCALC 101 (H) 11/24/2018   Lab Results  Component Value Date   TRIG 100.0 11/24/2018   Lab Results  Component Value Date   CHOLHDL 3 11/24/2018   No results found for: HGBA1C    Assessment & Plan:   #1 osteoporosis.  Last DEXA scan almost 2 years ago.  She has been on Fosamax now for 4 years.  -Continue daily calcium and vitamin D - continue regular weightbearing exercise -Set up repeat DEXA.  She is scheduled for mammogram 1 week from now we will try to get same date at breast center if possible -Recommend 1 more year Fosamax.  At that point would recommend interval off  #2 hypothyroidism -Continue levothyroxine -Recheck TSH at physical in the fall  #3 hyperlipidemia stable on simvastatin -Continue simvastatin and check lipids at  follow-up -We explained that she does not have a clear indication for daily aspirin at 68 and we recommend she discontinue  No orders of the defined types were placed in this encounter.   Follow-up: No follow-ups on file.    Carolann Littler, MD

## 2019-08-04 NOTE — Patient Instructions (Signed)
Set up physical for November or December and we can get labs then  I placed order for DEXA for 7/28 at the [facility].     Osteoporosis  Osteoporosis is thinning and loss of density in your bones. Osteoporosis makes bones more brittle and fragile and more likely to break (fracture). Over time, osteoporosis can cause your bones to become so weak that they fracture after a minor fall. Bones in the hip, wrist, and spine are most likely to fracture due to osteoporosis. What are the causes? The exact cause of this condition is not known. What increases the risk? You may be at greater risk for osteoporosis if you:  Have a family history of the condition.  Have poor nutrition.  Use steroid medicines, such as prednisone.  Are female.  Are age 52 or older.  Smoke or have a history of smoking.  Are not physically active (are sedentary).  Are white (Caucasian) or of Asian descent.  Have a small body frame.  Take certain medicines, such as antiseizure medicines. What are the signs or symptoms? A fracture might be the first sign of osteoporosis, especially if the fracture results from a fall or injury that usually would not cause a bone to break. Other signs and symptoms include:  Pain in the neck or low back.  Stooped posture.  Loss of height. How is this diagnosed? This condition may be diagnosed based on:  Your medical history.  A physical exam.  A bone mineral density test, also called a DXA or DEXA test (dual-energy X-ray absorptiometry test). This test uses X-rays to measure the amount of minerals in your bones. How is this treated? The goal of treatment is to strengthen your bones and lower your risk for a fracture. Treatment may involve:  Making lifestyle changes, such as: ? Including foods with more calcium and vitamin D in your diet. ? Doing weight-bearing and muscle-strengthening exercises. ? Stopping tobacco use. ? Limiting alcohol intake.  Taking medicine  to slow the process of bone loss or to increase bone density.  Taking daily supplements of calcium and vitamin D.  Taking hormone replacement medicines, such as estrogen for women and testosterone for men.  Monitoring your levels of calcium and vitamin D. Follow these instructions at home:  Activity  Exercise as told by your health care provider. Ask your health care provider what exercises and activities are safe for you. You should do: ? Exercises that make you work against gravity (weight-bearing exercises), such as tai chi, yoga, or walking. ? Exercises to strengthen muscles, such as lifting weights. Lifestyle  Limit alcohol intake to no more than 1 drink a day for nonpregnant women and 2 drinks a day for men. One drink equals 12 oz of beer, 5 oz of wine, or 1 oz of hard liquor.  Do not use any products that contain nicotine or tobacco, such as cigarettes and e-cigarettes. If you need help quitting, ask your health care provider. Preventing falls  Use devices to help you move around (mobility aids) as needed, such as canes, walkers, scooters, or crutches.  Keep rooms well-lit and clutter-free.  Remove tripping hazards from walkways, including cords and throw rugs.  Install grab bars in bathrooms and safety rails on stairs.  Use rubber mats in the bathroom and other areas that are often wet or slippery.  Wear closed-toe shoes that fit well and support your feet. Wear shoes that have rubber soles or low heels.  Review your medicines with your  health care provider. Some medicines can cause dizziness or changes in blood pressure, which can increase your risk of falling. General instructions  Include calcium and vitamin D in your diet. Calcium is important for bone health, and vitamin D helps your body to absorb calcium. Good sources of calcium and vitamin D include: ? Certain fatty fish, such as salmon and tuna. ? Products that have calcium and vitamin D added to them  (fortified products), such as fortified cereals. ? Egg yolks. ? Cheese. ? Liver.  Take over-the-counter and prescription medicines only as told by your health care provider.  Keep all follow-up visits as told by your health care provider. This is important. Contact a health care provider if:  You have never been screened for osteoporosis and you are: ? A woman who is age 1 or older. ? A man who is age 58 or older. Get help right away if:  You fall or injure yourself. Summary  Osteoporosis is thinning and loss of density in your bones. This makes bones more brittle and fragile and more likely to break (fracture),even with minor falls.  The goal of treatment is to strengthen your bones and reduce your risk for a fracture.  Include calcium and vitamin D in your diet. Calcium is important for bone health, and vitamin D helps your body to absorb calcium.  Talk with your health care provider about screening for osteoporosis if you are a woman who is age 19 or older, or a man who is age 101 or older. This information is not intended to replace advice given to you by your health care provider. Make sure you discuss any questions you have with your health care provider. Document Revised: 12/13/2016 Document Reviewed: 10/25/2016 Elsevier Patient Education  2020 [employer].

## 2019-08-11 ENCOUNTER — Other Ambulatory Visit: Payer: Self-pay

## 2019-08-11 ENCOUNTER — Ambulatory Visit
Admission: RE | Admit: 2019-08-11 | Discharge: 2019-08-11 | Disposition: A | Discharge status: Home / Self Care | Payer: Medicare HMO | Source: Ambulatory Visit | Attending: Family Medicine | Admitting: Family Medicine

## 2019-08-11 DIAGNOSIS — Z1231 Encounter for screening mammogram for malignant neoplasm of breast: Secondary | ICD-10-CM | POA: Diagnosis not present

## 2019-08-13 ENCOUNTER — Telehealth: Payer: Self-pay | Admitting: Family Medicine

## 2019-08-13 NOTE — Telephone Encounter (Signed)
Left message for patient to schedule Annual Wellness Visit.  Please schedule with Nurse Health Advisor Shannon Crews, RN at [facility]  

## 2019-08-19 ENCOUNTER — Encounter: Payer: Self-pay | Admitting: Family Medicine

## 2019-09-09 DIAGNOSIS — F3130 Bipolar disorder, current episode depressed, mild or moderate severity, unspecified: Secondary | ICD-10-CM | POA: Diagnosis not present

## 2019-09-10 ENCOUNTER — Other Ambulatory Visit: Payer: Self-pay | Admitting: Family Medicine

## 2019-10-21 ENCOUNTER — Telehealth: Payer: Self-pay | Admitting: Family Medicine

## 2019-10-21 NOTE — Progress Notes (Signed)
  Chronic Care Management   Outreach Note  10/21/2019 Name: Tammy [name] MRN: [id number] DOB: Jun 25, 1950  Referred by: Eulas Post, MD Reason for referral : No chief complaint on file.   An unsuccessful telephone outreach was attempted today. The patient was referred to the pharmacist for assistance with care management and care coordination.   Follow Up Plan:   Carley Perdue [employer] Scheduler

## 2019-10-27 ENCOUNTER — Telehealth: Payer: Self-pay | Admitting: Family Medicine

## 2019-10-27 NOTE — Progress Notes (Signed)
  Chronic Care Management   Outreach Note  10/27/2019 Name: Tammy [name] MRN: [id number] DOB: 1950/10/29  Referred by: Eulas Post, MD Reason for referral : No chief complaint on file.   A second unsuccessful telephone outreach was attempted today. The patient was referred to pharmacist for assistance with care management and care coordination.  Follow Up Plan:   Carley Perdue [employer] Scheduler

## 2019-10-28 ENCOUNTER — Encounter: Payer: Self-pay | Admitting: Family Medicine

## 2019-11-01 DIAGNOSIS — F3130 Bipolar disorder, current episode depressed, mild or moderate severity, unspecified: Secondary | ICD-10-CM | POA: Diagnosis not present

## 2019-11-02 ENCOUNTER — Other Ambulatory Visit: Payer: Self-pay | Admitting: Family Medicine

## 2019-11-03 ENCOUNTER — Telehealth: Payer: Self-pay | Admitting: *Deleted

## 2019-11-03 MED ORDER — ALENDRONATE SODIUM 70 MG PO TABS: ORAL_TABLET | ORAL | 3 refills | Status: DC

## 2019-11-03 NOTE — Telephone Encounter (Signed)
Patient called stating [facility] to [facility]. Patient states its the [facility] and per pt they do not have her prescription.

## 2019-11-03 NOTE — Telephone Encounter (Signed)
Rx resent.

## 2019-11-04 ENCOUNTER — Other Ambulatory Visit: Payer: Self-pay

## 2019-11-04 ENCOUNTER — Ambulatory Visit (INDEPENDENT_AMBULATORY_CARE_PROVIDER_SITE_OTHER): Payer: Medicare HMO

## 2019-11-04 DIAGNOSIS — Z23 Encounter for immunization: Secondary | ICD-10-CM | POA: Diagnosis not present

## 2019-11-09 ENCOUNTER — Ambulatory Visit
Admission: RE | Admit: 2019-11-09 | Discharge: 2019-11-09 | Disposition: A | Discharge status: Home / Self Care | Payer: Medicare HMO | Source: Ambulatory Visit | Attending: Family Medicine | Admitting: Family Medicine

## 2019-11-09 ENCOUNTER — Other Ambulatory Visit: Payer: Self-pay

## 2019-11-09 DIAGNOSIS — M810 Age-related osteoporosis without current pathological fracture: Secondary | ICD-10-CM

## 2019-11-17 ENCOUNTER — Telehealth: Payer: Self-pay | Admitting: Family Medicine

## 2019-11-17 NOTE — Progress Notes (Signed)
  Chronic Care Management   Outreach Note  11/17/2019 Name: Tammy [name] MRN: [id number] DOB: 1950/02/16  Referred by: Eulas Post, MD Reason for referral : No chief complaint on file.   Third unsuccessful telephone outreach was attempted today. The patient was referred to the pharmacist for assistance with care management and care coordination.   Follow Up Plan:   Carley Perdue [employer] Scheduler

## 2019-11-18 DIAGNOSIS — F3340 Major depressive disorder, recurrent, in remission, unspecified: Secondary | ICD-10-CM | POA: Insufficient documentation

## 2019-11-23 ENCOUNTER — Other Ambulatory Visit: Payer: Self-pay | Admitting: Family

## 2019-11-23 DIAGNOSIS — M810 Age-related osteoporosis without current pathological fracture: Secondary | ICD-10-CM

## 2019-12-20 ENCOUNTER — Encounter: Payer: Medicare HMO | Admitting: Family Medicine

## 2020-01-19 ENCOUNTER — Telehealth: Payer: Self-pay | Admitting: Family Medicine

## 2020-01-19 NOTE — Telephone Encounter (Signed)
Left message for patient to call back and schedule Medicare Annual Wellness Visit (AWV) either virtually or in office.   Last AWV 10/29/2016  please schedule at anytime with [facility] Nurse Health Advisor 1 or 2   This should be a 45 minute visit.

## 2020-03-03 ENCOUNTER — Other Ambulatory Visit: Payer: Medicare HMO

## 2020-03-06 ENCOUNTER — Other Ambulatory Visit: Payer: Self-pay | Admitting: Family Medicine

## 2020-03-16 ENCOUNTER — Telehealth: Payer: Self-pay | Admitting: Family Medicine

## 2020-03-16 NOTE — Telephone Encounter (Signed)
Left message for patient to call back and schedule Medicare Annual Wellness Visit (AWV) either virtually or in office. No detailed message left    Last AWV 10/29/16  please schedule at anytime with [facility] Nurse Health Advisor 1 or 2   This should be a 45 minute visit.

## 2020-04-05 ENCOUNTER — Other Ambulatory Visit: Payer: Self-pay

## 2020-04-05 ENCOUNTER — Ambulatory Visit
Admission: RE | Admit: 2020-04-05 | Discharge: 2020-04-05 | Disposition: A | Discharge status: Home / Self Care | Payer: Medicare HMO | Source: Ambulatory Visit | Attending: Family | Admitting: Family

## 2020-04-05 DIAGNOSIS — M810 Age-related osteoporosis without current pathological fracture: Secondary | ICD-10-CM

## 2020-05-26 ENCOUNTER — Encounter: Payer: Self-pay | Admitting: Gastroenterology

## 2020-06-14 ENCOUNTER — Telehealth: Payer: Self-pay | Admitting: Family Medicine

## 2020-06-14 NOTE — Telephone Encounter (Signed)
Spoke with patient see transferred to  [facility] [place],

## 2020-06-14 NOTE — Telephone Encounter (Signed)
Left message asking patient to call.  I wanted to verity that patient changed providers.    If not please schedule AWV  Last awv 10/29/16

## 2020-06-26 DIAGNOSIS — D72819 Decreased white blood cell count, unspecified: Secondary | ICD-10-CM | POA: Insufficient documentation

## 2020-07-07 ENCOUNTER — Other Ambulatory Visit: Payer: Self-pay | Admitting: Family Medicine

## 2020-07-07 DIAGNOSIS — Z1231 Encounter for screening mammogram for malignant neoplasm of breast: Secondary | ICD-10-CM

## 2020-07-31 ENCOUNTER — Other Ambulatory Visit: Payer: Self-pay | Admitting: Family Medicine

## 2020-08-14 ENCOUNTER — Other Ambulatory Visit: Payer: Self-pay

## 2020-08-14 ENCOUNTER — Ambulatory Visit (AMBULATORY_SURGERY_CENTER): Payer: Self-pay

## 2020-08-14 VITALS — Ht 59.0 in | Wt 105.0 lb

## 2020-08-14 DIAGNOSIS — Z1211 Encounter for screening for malignant neoplasm of colon: Secondary | ICD-10-CM

## 2020-08-14 MED ORDER — NA SULFATE-K SULFATE-MG SULF 17.5-3.13-1.6 GM/177ML PO SOLN: 1.0000 | Freq: Once | ORAL | 0 refills | Status: AC

## 2020-08-14 NOTE — Progress Notes (Signed)

## 2020-08-28 ENCOUNTER — Encounter: Payer: Self-pay | Admitting: Gastroenterology

## 2020-08-28 ENCOUNTER — Ambulatory Visit (AMBULATORY_SURGERY_CENTER): Payer: Medicare HMO | Admitting: Gastroenterology

## 2020-08-28 ENCOUNTER — Other Ambulatory Visit: Payer: Self-pay

## 2020-08-28 VITALS — BP 107/69 | HR 76 | Temp 97.5°F | Resp 14 | Ht 59.0 in | Wt 105.0 lb

## 2020-08-28 DIAGNOSIS — Z86010 Personal history of colonic polyps: Secondary | ICD-10-CM

## 2020-08-28 DIAGNOSIS — Z800 Family history of malignant neoplasm of digestive organs: Secondary | ICD-10-CM | POA: Diagnosis not present

## 2020-08-28 MED ORDER — SODIUM CHLORIDE 0.9 % IV SOLN: 500.0000 mL | Freq: Once | INTRAVENOUS | Status: DC

## 2020-08-28 NOTE — Progress Notes (Signed)
Report given to PACU, vss 

## 2020-08-28 NOTE — Op Note (Signed)
[facility] Patient Name: Tammy [name] Procedure Date: 08/28/2020 2:58 PM MRN: [id number] Endoscopist: Milus Banister , MD Age: 70 Referring MD:  Date of Birth: September 17, 1950 Gender: Female Account #: [id number] Procedure:                Colonoscopy Indications:              High risk colon cancer surveillance: Personal                            history of colonic polyps; Colonoscopy 2017 single                            subCM SSP, FH of colon cancer Medicines:                Monitored Anesthesia Care Procedure:                Pre-Anesthesia Assessment:                           - Prior to the procedure, a History and Physical                            was performed, and patient medications and                            allergies were reviewed. The patient's tolerance of                            previous anesthesia was also reviewed. The risks                            and benefits of the procedure and the sedation                            options and risks were discussed with the patient.                            All questions were answered, and informed consent                            was obtained. Prior Anticoagulants: The patient has                            taken no previous anticoagulant or antiplatelet                            agents. ASA Grade Assessment: II - A patient with                            mild systemic disease. After reviewing the risks                            and benefits, the patient was deemed in  satisfactory condition to undergo the procedure.                           After obtaining informed consent, the colonoscope                            was passed under direct vision. Throughout the                            procedure, the patient's blood pressure, pulse, and                            oxygen saturations were monitored continuously. The                            Olympus [id number]  Colonoscope was                            introduced through the anus and advanced to the the                            cecum, identified by appendiceal orifice and                            ileocecal valve. The Olympus [id number] ([id number])                            Colonoscope was introduced through the and advanced                            to the. The colonoscopy was performed without                            difficulty. The patient tolerated the procedure                            well. The quality of the bowel preparation was                            good. The ileocecal valve, appendiceal orifice, and                            rectum were photographed. Scope In: 3:04:12 PM Scope Out: 3:30:54 PM Scope Withdrawal Time: 0 hours 4 minutes 34 seconds  Total Procedure Duration: 0 hours 26 minutes 42 seconds  Findings:                 Normal colon.                           The exam was otherwise without abnormality on                            direct and retroflexion views.  No polyps or cancers. Complications:            No immediate complications. Estimated blood loss:                            None. Estimated Blood Loss:     Estimated blood loss: none. Impression:               - Normal colon.                           - The examination was otherwise normal on direct                            and retroflexion views.                           - No polyps or cancers. Recommendation:           - Patient has a contact number available for                            emergencies. The signs and symptoms of potential                            delayed complications were discussed with the                            patient. Return to normal activities tomorrow.                            Written discharge instructions were provided to the                            patient.                           - Resume previous diet.                           -  Continue present medications.                           - No repeat colonoscopy due to current age 78. Milus Banister, MD 08/28/2020 3:34:30 PM This report has been signed electronically.

## 2020-08-28 NOTE — Progress Notes (Signed)
Pt's eyes red and watery after procedure. Per CRNA the oxygen used during the procedure may have blown up in her eyes and caused some dryness.

## 2020-08-28 NOTE — Progress Notes (Signed)
C.W. vital signs. 

## 2020-08-28 NOTE — Progress Notes (Signed)
Pt's states no medical or surgical changes since previsit or office visit. 

## 2020-08-28 NOTE — Patient Instructions (Signed)
No repeat colonoscopy due to current age 70.   YOU HAD AN ENDOSCOPIC PROCEDURE TODAY AT [facility]:   Refer to the procedure report that was given to you for any specific questions about what was found during the examination.  If the procedure report does not answer your questions, please call your gastroenterologist to clarify.  If you requested that your care partner not be given the details of your procedure findings, then the procedure report has been included in a sealed envelope for you to review at your convenience later.  YOU SHOULD EXPECT: Some feelings of bloating in the abdomen. Passage of more gas than usual.  Walking can help get rid of the air that was put into your GI tract during the procedure and reduce the bloating. If you had a lower endoscopy (such as a colonoscopy or flexible sigmoidoscopy) you may notice spotting of blood in your stool or on the toilet paper. If you underwent a bowel prep for your procedure, you may not have a normal bowel movement for a few days.  Please Note:  You might notice some irritation and congestion in your nose or some drainage.  This is from the oxygen used during your procedure.  There is no need for concern and it should clear up in a day or so.  SYMPTOMS TO REPORT IMMEDIATELY:  Following lower endoscopy (colonoscopy or flexible sigmoidoscopy):  Excessive amounts of blood in the stool  Significant tenderness or worsening of abdominal pains  Swelling of the abdomen that is new, acute  Fever of 100F or higher   For urgent or emergent issues, a gastroenterologist can be reached at any hour by calling [phone]. Do not use MyChart messaging for urgent concerns.    DIET:  We do recommend a small meal at first, but then you may proceed to your regular diet.  Drink plenty of fluids but you should avoid alcoholic beverages for 24 hours.  ACTIVITY:  You should plan to take it easy for the rest of today and you should NOT DRIVE  or use heavy machinery until tomorrow (because of the sedation medicines used during the test).    FOLLOW UP: Our staff will call the number listed on your records 48-72 hours following your procedure to check on you and address any questions or concerns that you may have regarding the information given to you following your procedure. If we do not reach you, we will leave a message.  We will attempt to reach you two times.  During this call, we will ask if you have developed any symptoms of COVID 19. If you develop any symptoms (ie: fever, flu-like symptoms, shortness of breath, cough etc.) before then, please call [phone].  If you test positive for Covid 19 in the 2 weeks post procedure, please call and report this information to [place].    If any biopsies were taken you will be contacted by phone or by letter within the next 1-3 weeks.  Please call us at [phone] if you have not heard about the biopsies in 3 weeks.    SIGNATURES/CONFIDENTIALITY: You and/or your care partner have signed paperwork which will be entered into your electronic medical record.  These signatures attest to the fact that that the information above on your After Visit Summary has been reviewed and is understood.  Full responsibility of the confidentiality of this discharge information lies with you and/or your care-partner.

## 2020-08-28 NOTE — Progress Notes (Signed)
HPI: This is a woman with fh CRC, ph of colon polyps   ROS: complete GI ROS as described in HPI, all other review negative.  Constitutional:  No unintentional weight loss   Past Medical History:  Diagnosis Date   Allergic rhinitis    Allergy    seasonal   Anxiety    Depression    History of colonic polyps    Hyperlipemia    Hypothyroidism    Osteoporosis     Past Surgical History:  Procedure Laterality Date   APPENDECTOMY     BREAST BIOPSY Right 1989   benign   CATARACT EXTRACTION BILATERAL W/ ANTERIOR VITRECTOMY Bilateral 2019   Dooms only   hyperplastic    Current Outpatient Medications  Medication Sig Dispense Refill   alendronate (FOSAMAX) 70 MG tablet TAKE 1 TABLET EVERY WEEK AS DIRECTED. SEE PACKAGE FOR ADDITONAL INSTRUCTIONS. NEEDS BONE DENSITY TEST. 12 tablet 3   aspirin 81 MG tablet Take 81 mg by mouth daily.       Calcium Carbonate (CALCIUM 600 PO) Take by mouth. Takes one tablet daily.     Calcium Citrate-Vitamin D (CITRACAL/VITAMIN D PO) Take by mouth. Takes 1 tablet daily. 500IU vitamin D and '630mg'$  calcium.     divalproex (DEPAKOTE ER) 250 MG 24 hr tablet Take 250 mg by mouth. Three tabs daily     levothyroxine (SYNTHROID) 88 MCG tablet TAKE 1 TABLET EVERY DAY (Patient taking differently: 75 mcg.) 90 tablet 1   Multiple Vitamins-Minerals (WOMENS 50+ MULTI VITAMIN/MIN PO) Take by mouth daily.     sertraline (ZOLOFT) 100 MG tablet Take 200 mg by mouth daily. Take 2 tablet bu mouth daily.     simvastatin (ZOCOR) 20 MG tablet TAKE 1 TABLET AT BEDTIME 90 tablet 0   Current Facility-Administered Medications  Medication Dose Route Frequency Provider Last Rate Last Admin   0.9 %  sodium chloride infusion  500 mL Intravenous Once Milus Banister, MD        Allergies as of 08/28/2020   (No Known Allergies)    Family History  Problem Relation Age of Onset    Osteoporosis Mother    Hypothyroidism Mother    Gout Father    Hyperlipidemia Father    Cancer Father        Pancreatic Tumor   Breast cancer Maternal Aunt    Colon cancer Paternal Uncle    Colon cancer Maternal Grandfather    Colon cancer Cousin    Hyperlipidemia Other    Colon polyps Neg Hx    Esophageal cancer Neg Hx    Stomach cancer Neg Hx    Rectal cancer Neg Hx     Social History   Socioeconomic History   Marital status: Divorced    Spouse name: Not on file   Number of children: Not on file   Years of education: Not on file   Highest education level: Not on file  Occupational History   Occupation: unemployed  Tobacco Use   Smoking status: Never    Passive exposure: Past   Smokeless tobacco: Never  Vaping Use   Vaping Use: Never used  Substance and Sexual Activity   Alcohol use: Not Currently    Comment: occasional red wine couple times a year   Drug use: Never   Sexual activity: Yes  Other Topics Concern   Not on file  Social History  Narrative   Single, presently unemployed without health insurance   Social Determinants of Health   Financial Resource Strain: Not on file  Food Insecurity: Not on file  Transportation Needs: Not on file  Physical Activity: Not on file  Stress: Not on file  Social Connections: Not on file  Intimate Partner Violence: Not on file     Physical Exam: BP 116/69   Pulse 76   Temp (!) 97.5 F (36.4 C)   Ht '4\' 11"'$  (1.499 m)   Wt 105 lb (47.6 kg)   SpO2 96%   BMI 21.21 kg/m  Constitutional: generally well-appearing Psychiatric: alert and oriented x3 Lungs: CTA bilaterally Heart: no MCR  Assessment and plan: 70 y.o. female with fh CRC, PH colon polyps  Colonsocopy today  Care is appropriate for the ambulatory setting.  Owens Loffler, MD [facility] 08/28/2020, 2:55 PM

## 2020-08-30 ENCOUNTER — Telehealth: Payer: Self-pay | Admitting: *Deleted

## 2020-08-30 NOTE — Telephone Encounter (Signed)
First follow up call attempt.  LVM. 

## 2020-08-30 NOTE — Telephone Encounter (Signed)
No answer for post procedure call. Left VM.

## 2020-08-31 ENCOUNTER — Ambulatory Visit
Admission: RE | Admit: 2020-08-31 | Discharge: 2020-08-31 | Disposition: A | Discharge status: Home / Self Care | Payer: Medicare HMO | Source: Ambulatory Visit | Attending: Family Medicine | Admitting: Family Medicine

## 2020-08-31 ENCOUNTER — Other Ambulatory Visit: Payer: Self-pay

## 2020-08-31 DIAGNOSIS — Z1231 Encounter for screening mammogram for malignant neoplasm of breast: Secondary | ICD-10-CM

## 2020-10-11 ENCOUNTER — Other Ambulatory Visit: Payer: Self-pay | Admitting: Family Medicine

## 2020-12-20 ENCOUNTER — Other Ambulatory Visit: Payer: Self-pay | Admitting: Family Medicine

## 2021-01-12 DIAGNOSIS — R413 Other amnesia: Secondary | ICD-10-CM | POA: Insufficient documentation

## 2021-08-08 ENCOUNTER — Other Ambulatory Visit: Payer: Self-pay | Admitting: Internal Medicine

## 2021-08-08 DIAGNOSIS — Z1231 Encounter for screening mammogram for malignant neoplasm of breast: Secondary | ICD-10-CM

## 2021-09-05 ENCOUNTER — Ambulatory Visit
Admission: RE | Admit: 2021-09-05 | Discharge: 2021-09-05 | Disposition: A | Discharge status: Home / Self Care | Payer: Medicare HMO | Source: Ambulatory Visit | Attending: Internal Medicine | Admitting: Internal Medicine

## 2021-09-05 DIAGNOSIS — Z1231 Encounter for screening mammogram for malignant neoplasm of breast: Secondary | ICD-10-CM

## 2021-09-27 ENCOUNTER — Other Ambulatory Visit: Payer: Self-pay | Admitting: Family Medicine

## 2022-03-04 DIAGNOSIS — K219 Gastro-esophageal reflux disease without esophagitis: Secondary | ICD-10-CM | POA: Insufficient documentation

## 2022-03-06 ENCOUNTER — Ambulatory Visit
Admission: RE | Admit: 2022-03-06 | Discharge: 2022-03-06 | Disposition: A | Discharge status: Home / Self Care | Payer: Medicare HMO | Source: Ambulatory Visit | Attending: Geriatric Medicine | Admitting: Geriatric Medicine

## 2022-03-06 ENCOUNTER — Other Ambulatory Visit: Payer: Self-pay | Admitting: Geriatric Medicine

## 2022-03-06 DIAGNOSIS — R0789 Other chest pain: Secondary | ICD-10-CM

## 2022-04-16 ENCOUNTER — Ambulatory Visit: Payer: Medicare HMO | Attending: Internal Medicine

## 2022-04-16 ENCOUNTER — Other Ambulatory Visit: Payer: Self-pay | Admitting: Geriatric Medicine

## 2022-04-16 ENCOUNTER — Other Ambulatory Visit: Payer: Self-pay | Admitting: *Deleted

## 2022-04-16 DIAGNOSIS — R079 Chest pain, unspecified: Secondary | ICD-10-CM

## 2022-04-16 LAB — EXERCISE TOLERANCE TEST
Angina Index: 0
Duke Treadmill Score: 6
Estimated workload: 7.2
Exercise duration (min): 6 min
Exercise duration (sec): 9 s
MPHR: 147 {beats}/min
Peak HR: 142 {beats}/min
Percent HR: 96 %
RPE: 17
Rest HR: 74 {beats}/min
ST Depression (mm): 0 mm

## 2022-04-19 ENCOUNTER — Other Ambulatory Visit: Payer: Self-pay | Admitting: Adult Health

## 2022-04-19 DIAGNOSIS — M810 Age-related osteoporosis without current pathological fracture: Secondary | ICD-10-CM

## 2022-07-15 ENCOUNTER — Encounter (HOSPITAL_BASED_OUTPATIENT_CLINIC_OR_DEPARTMENT_OTHER): Payer: Self-pay

## 2022-07-15 ENCOUNTER — Emergency Department (HOSPITAL_BASED_OUTPATIENT_CLINIC_OR_DEPARTMENT_OTHER)
Admission: EM | Admit: 2022-07-15 | Discharge: 2022-07-16 | Disposition: A | Discharge status: Home / Self Care | Payer: Medicare HMO | Attending: Emergency Medicine | Admitting: Emergency Medicine

## 2022-07-15 ENCOUNTER — Other Ambulatory Visit: Payer: Self-pay

## 2022-07-15 DIAGNOSIS — M79651 Pain in right thigh: Secondary | ICD-10-CM | POA: Insufficient documentation

## 2022-07-15 DIAGNOSIS — Z7982 Long term (current) use of aspirin: Secondary | ICD-10-CM | POA: Insufficient documentation

## 2022-07-15 DIAGNOSIS — T63461A Toxic effect of venom of wasps, accidental (unintentional), initial encounter: Secondary | ICD-10-CM | POA: Diagnosis not present

## 2022-07-15 DIAGNOSIS — M79661 Pain in right lower leg: Secondary | ICD-10-CM | POA: Insufficient documentation

## 2022-07-15 NOTE — ED Triage Notes (Signed)
Patient here POV from Home.  Endorses being stung twice when a small wasp got caught in her Pant Leg. Stung once in the right buttock and then again in the Right Lower Leg.   Occurred 3 Hours ago. Patient has concern for FB (Stinger).   NAD Noted during Triage. A&Ox4. GCS 15. Ambulatory.

## 2022-07-16 MED ORDER — BENADRYL ITCH STOPPING 2 % EX GEL: 1.0000 "application " | Freq: Four times a day (QID) | CUTANEOUS | 0 refills | Status: DC | PRN

## 2022-07-16 MED ORDER — PREDNISONE 10 MG PO TABS: 20.0000 mg | ORAL_TABLET | Freq: Every day | ORAL | 0 refills | Status: DC

## 2022-07-16 NOTE — Discharge Instructions (Signed)
Please return to the ER if you have any shortness of breath, swelling on your lips or mouth, or have numbness, tingling, loss of blood flow in your legs.  Please use the Benadryl cream around the sites, to help with the swelling, as well as take the prednisone.

## 2022-07-16 NOTE — ED Provider Notes (Signed)
[place] EMERGENCY DEPARTMENT AT [facility] Provider Note   CSN: [id number] Arrival date & time: 07/15/22  2152     History  Chief Complaint  Patient presents with   Insect Bite    Tammy [name] is a 72 y.o. female, who presents to the ED secondary to being stung by some wasps around 7 PM tonight.  She states she thinks she may have a stinger in her.  She states that she has pain around her right buttocks, and right lower leg.  Denies any shortness of breath, swelling, rash, just states is a little swollen at those areas.    Home Medications Prior to Admission medications   Medication Sig Start Date End Date Taking? Authorizing Provider  DIPHENHYDRAMINE HCL, TOPICAL, (BENADRYL ITCH STOPPING) 2 % GEL Apply 1 application  topically 4 (four) times daily as needed. 07/16/22  Yes Jamorian Dimaria L, PA  predniSONE (DELTASONE) 10 MG tablet Take 2 tablets (20 mg total) by mouth daily. 07/16/22  Yes Alexya Mcdaris L, PA  alendronate (FOSAMAX) 70 MG tablet TAKE 1 TABLET EVERY WEEK AS DIRECTED. SEE PACKAGE FOR ADDITONAL INSTRUCTIONS. NEEDS BONE DENSITY TEST. 10/11/20   Burchette, Elberta Fortis, MD  aspirin 81 MG tablet Take 81 mg by mouth daily.      [provider]  Calcium Carbonate (CALCIUM 600 PO) Take by mouth. Takes one tablet daily.    [provider]  Calcium Citrate-Vitamin D (CITRACAL/VITAMIN D PO) Take by mouth. Takes 1 tablet daily. 500IU vitamin D and 630mg  calcium.    [provider]  divalproex (DEPAKOTE ER) 250 MG 24 hr tablet Take 250 mg by mouth. Three tabs daily    [provider]  levothyroxine (SYNTHROID) 88 MCG tablet TAKE 1 TABLET EVERY DAY Patient taking differently: 75 mcg. 09/10/19   Burchette, Elberta Fortis, MD  Multiple Vitamins-Minerals (WOMENS 50+ MULTI VITAMIN/MIN PO) Take by mouth daily.    [provider]  sertraline (ZOLOFT) 100 MG tablet Take 200 mg by mouth daily. Take 2 tablet bu mouth daily.    [provider]   simvastatin (ZOCOR) 20 MG tablet TAKE 1 TABLET AT BEDTIME 07/31/20   Burchette, Elberta Fortis, MD      Allergies    Patient has no known allergies.    Review of Systems   Review of Systems  Respiratory:  Negative for shortness of breath.   Skin:  Negative for wound.    Physical Exam Updated Vital Signs BP 120/68 (BP Location: Right Arm)   Pulse 87   Temp 97.8 F (36.6 C) (Temporal)   Resp 18   Ht 4\' 11"  (1.499 m)   Wt 50.8 kg   SpO2 98%   BMI 22.62 kg/m  Physical Exam Vitals and nursing note reviewed.  Constitutional:      General: She is not in acute distress.    Appearance: She is well-developed.  HENT:     Head: Normocephalic and atraumatic.  Eyes:     Conjunctiva/sclera: Conjunctivae normal.  Cardiovascular:     Rate and Rhythm: Normal rate and regular rhythm.     Heart sounds: No murmur heard. Pulmonary:     Effort: Pulmonary effort is normal. No respiratory distress.     Breath sounds: Normal breath sounds.  Abdominal:     Palpations: Abdomen is soft.     Tenderness: There is no abdominal tenderness.  Musculoskeletal:        General: No swelling.     Cervical back: Neck  supple.  Skin:    General: Skin is warm and dry.     Capillary Refill: Capillary refill takes less than 2 seconds.     Comments: Mild sites of slight edema, and erythema, along the right posterior lower leg, and right posterior buttocks.  No stingers noted.  Neurological:     Mental Status: She is alert.  Psychiatric:        Mood and Affect: Mood normal.     ED Results / Procedures / Treatments   Labs (all labs ordered are listed, but only abnormal results are displayed) Labs Reviewed - No data to display  EKG None  Radiology No results found.  Procedures Procedures   Medications Ordered in ED Medications - No data to display  ED Course/ Medical Decision Making/ A&P                             Medical Decision Making Patient is a 72 year old female, here for wasp bites on  her bottom, this happened around 7 PM.  She has no systemic signs, just some swelling and tenderness around the sites.  There are no stingers left.  Will discharge her with prednisone, and Benadryl cream around the site, discussed return precautions she voiced understanding.   Final diagnoses:  Wasp sting, accidental or unintentional, initial encounter    Rx / DC Orders ED Discharge Orders          Ordered    DIPHENHYDRAMINE HCL, TOPICAL, (BENADRYL ITCH STOPPING) 2 % GEL  4 times daily PRN        07/16/22 0007    predniSONE (DELTASONE) 10 MG tablet  Daily        07/16/22 0007              Allexus Ovens, Harley Alto, PA 07/16/22 0008    Alvira Monday, MD 07/16/22 2302

## 2022-07-16 NOTE — ED Provider Notes (Incomplete)
EMERGENCY DEPARTMENT AT [facility] Provider Note   CSN: [id number] Arrival date & time: 07/15/22  2152     History {Add pertinent medical, surgical, social history, OB history to HPI:1} Chief Complaint  Patient presents with  . Insect Bite    Tammy [name] is a 72 y.o. female, who presents to the ED secondary to being stung by some wasps around 7 PM tonight.  She states she thinks she may have a stinger in her.  She states that she has pain around her right buttocks, and right lower leg.  Denies any shortness of breath, swelling, rash, just states is a little swollen at those areas.    Home Medications Prior to Admission medications   Medication Sig Start Date End Date Taking? Authorizing Provider  alendronate (FOSAMAX) 70 MG tablet TAKE 1 TABLET EVERY WEEK AS DIRECTED. SEE PACKAGE FOR ADDITONAL INSTRUCTIONS. NEEDS BONE DENSITY TEST. 10/11/20   Burchette, Elberta Fortis, MD  aspirin 81 MG tablet Take 81 mg by mouth daily.      [provider]  Calcium Carbonate (CALCIUM 600 PO) Take by mouth. Takes one tablet daily.    [provider]  Calcium Citrate-Vitamin D (CITRACAL/VITAMIN D PO) Take by mouth. Takes 1 tablet daily. 500IU vitamin D and 630mg  calcium.    [provider]  divalproex (DEPAKOTE ER) 250 MG 24 hr tablet Take 250 mg by mouth. Three tabs daily    [provider]  levothyroxine (SYNTHROID) 88 MCG tablet TAKE 1 TABLET EVERY DAY Patient taking differently: 75 mcg. 09/10/19   Burchette, Elberta Fortis, MD  Multiple Vitamins-Minerals (WOMENS 50+ MULTI VITAMIN/MIN PO) Take by mouth daily.    [provider]  sertraline (ZOLOFT) 100 MG tablet Take 200 mg by mouth daily. Take 2 tablet bu mouth daily.    [provider]  simvastatin (ZOCOR) 20 MG tablet TAKE 1 TABLET AT BEDTIME 07/31/20   Burchette, Elberta Fortis, MD      Allergies    Patient has no known allergies.    Review of Systems   Review of Systems   Respiratory:  Negative for shortness of breath.   Skin:  Negative for wound.    Physical Exam Updated Vital Signs BP 120/68 (BP Location: Right Arm)   Pulse 87   Temp 97.8 F (36.6 C) (Temporal)   Resp 18   Ht 4\' 11"  (1.499 m)   Wt 50.8 kg   SpO2 98%   BMI 22.62 kg/m  Physical Exam Vitals and nursing note reviewed.  Constitutional:      General: She is not in acute distress.    Appearance: She is well-developed.  HENT:     Head: Normocephalic and atraumatic.  Eyes:     Conjunctiva/sclera: Conjunctivae normal.  Cardiovascular:     Rate and Rhythm: Normal rate and regular rhythm.     Heart sounds: No murmur heard. Pulmonary:     Effort: Pulmonary effort is normal. No respiratory distress.     Breath sounds: Normal breath sounds.  Abdominal:     Palpations: Abdomen is soft.     Tenderness: There is no abdominal tenderness.  Musculoskeletal:        General: No swelling.     Cervical back: Neck supple.  Skin:    General: Skin is warm and dry.     Capillary Refill: Capillary refill takes less than 2 seconds.     Comments: Mild sites of slight edema, and erythema, along the right posterior  lower leg, and right posterior buttocks.  No stingers noted.  Neurological:     Mental Status: She is alert.  Psychiatric:        Mood and Affect: Mood normal.     ED Results / Procedures / Treatments   Labs (all labs ordered are listed, but only abnormal results are displayed) Labs Reviewed - No data to display  EKG None  Radiology No results found.  Procedures Procedures  {Document cardiac monitor, telemetry assessment procedure when appropriate:1}  Medications Ordered in ED Medications - No data to display  ED Course/ Medical Decision Making/ A&P   {   Click here for ABCD2, HEART and other calculatorsREFRESH Note before signing :1}                          Medical Decision Making  ***  {Document critical care time when appropriate:1} {Document review of labs  and clinical decision tools ie heart score, Chads2Vasc2 etc:1}  {Document your independent review of radiology images, and any outside records:1} {Document your discussion with family members, caretakers, and with consultants:1} {Document social determinants of health affecting pt's care:1} {Document your decision making why or why not admission, treatments were needed:1} Final Clinical Impression(s) / ED Diagnoses Final diagnoses:  None    Rx / DC Orders ED Discharge Orders     None

## 2022-08-08 ENCOUNTER — Other Ambulatory Visit: Payer: Self-pay | Admitting: Geriatric Medicine

## 2022-08-08 DIAGNOSIS — Z1231 Encounter for screening mammogram for malignant neoplasm of breast: Secondary | ICD-10-CM

## 2022-09-09 ENCOUNTER — Ambulatory Visit: Payer: Medicare HMO

## 2022-09-11 ENCOUNTER — Ambulatory Visit
Admission: RE | Admit: 2022-09-11 | Discharge: 2022-09-11 | Disposition: A | Discharge status: Home / Self Care | Payer: Medicare HMO | Source: Ambulatory Visit | Attending: Geriatric Medicine | Admitting: Geriatric Medicine

## 2022-09-11 DIAGNOSIS — Z1231 Encounter for screening mammogram for malignant neoplasm of breast: Secondary | ICD-10-CM

## 2022-10-19 ENCOUNTER — Emergency Department (HOSPITAL_COMMUNITY): Payer: Medicare HMO

## 2022-10-19 ENCOUNTER — Other Ambulatory Visit: Payer: Self-pay

## 2022-10-19 ENCOUNTER — Emergency Department (HOSPITAL_COMMUNITY)
Admission: EM | Admit: 2022-10-19 | Discharge: 2022-10-19 | Disposition: A | Discharge status: Home / Self Care | Payer: Medicare HMO | Attending: Emergency Medicine | Admitting: Emergency Medicine

## 2022-10-19 DIAGNOSIS — E039 Hypothyroidism, unspecified: Secondary | ICD-10-CM | POA: Diagnosis not present

## 2022-10-19 DIAGNOSIS — Z79890 Hormone replacement therapy (postmenopausal): Secondary | ICD-10-CM | POA: Insufficient documentation

## 2022-10-19 DIAGNOSIS — M25511 Pain in right shoulder: Secondary | ICD-10-CM | POA: Insufficient documentation

## 2022-10-19 DIAGNOSIS — W06XXXA Fall from bed, initial encounter: Secondary | ICD-10-CM | POA: Insufficient documentation

## 2022-10-19 DIAGNOSIS — R42 Dizziness and giddiness: Secondary | ICD-10-CM | POA: Insufficient documentation

## 2022-10-19 DIAGNOSIS — Z7982 Long term (current) use of aspirin: Secondary | ICD-10-CM | POA: Insufficient documentation

## 2022-10-19 DIAGNOSIS — W19XXXA Unspecified fall, initial encounter: Secondary | ICD-10-CM

## 2022-10-19 DIAGNOSIS — D649 Anemia, unspecified: Secondary | ICD-10-CM | POA: Insufficient documentation

## 2022-10-19 LAB — TROPONIN I (HIGH SENSITIVITY)
Troponin I (High Sensitivity): 4 ng/L (ref <18)
Troponin I (High Sensitivity): 5 ng/L (ref <18)

## 2022-10-19 LAB — URINALYSIS, W/ REFLEX TO CULTURE (INFECTION SUSPECTED)
Bacteria, UA: NONE SEEN
Bilirubin Urine: NEGATIVE
Glucose, UA: NEGATIVE mg/dL
Ketones, ur: 5 mg/dL — AB
Leukocytes,Ua: NEGATIVE
Nitrite: NEGATIVE
Protein, ur: NEGATIVE mg/dL
Specific Gravity, Urine: 1.023 (ref 1.005–1.030)
pH: 5 (ref 5.0–8.0)

## 2022-10-19 LAB — CBC WITH DIFFERENTIAL/PLATELET
Abs Immature Granulocytes: 0.04 10*3/uL (ref 0.00–0.07)
Basophils Absolute: 0 10*3/uL (ref 0.0–0.1)
Basophils Relative: 0 %
Eosinophils Absolute: 0.1 10*3/uL (ref 0.0–0.5)
Eosinophils Relative: 2 %
HCT: 34.8 % — ABNORMAL LOW (ref 36.0–46.0)
Hemoglobin: 11.4 g/dL — ABNORMAL LOW (ref 12.0–15.0)
Immature Granulocytes: 1 %
Lymphocytes Relative: 8 %
Lymphs Abs: 0.4 10*3/uL — ABNORMAL LOW (ref 0.7–4.0)
MCH: 30 pg (ref 26.0–34.0)
MCHC: 32.8 g/dL (ref 30.0–36.0)
MCV: 91.6 fL (ref 80.0–100.0)
Monocytes Absolute: 0.4 10*3/uL (ref 0.1–1.0)
Monocytes Relative: 9 %
Neutro Abs: 4.1 10*3/uL (ref 1.7–7.7)
Neutrophils Relative %: 80 %
Platelets: 152 10*3/uL (ref 150–400)
RBC: 3.8 MIL/uL — ABNORMAL LOW (ref 3.87–5.11)
RDW: 13.6 % (ref 11.5–15.5)
WBC: 5.1 10*3/uL (ref 4.0–10.5)
nRBC: 0 % (ref 0.0–0.2)

## 2022-10-19 LAB — COMPREHENSIVE METABOLIC PANEL
ALT: 14 U/L (ref 0–44)
AST: 25 U/L (ref 15–41)
Albumin: 3.6 g/dL (ref 3.5–5.0)
Alkaline Phosphatase: 61 U/L (ref 38–126)
Anion gap: 9 (ref 5–15)
BUN: 22 mg/dL (ref 8–23)
CO2: 27 mmol/L (ref 22–32)
Calcium: 8.8 mg/dL — ABNORMAL LOW (ref 8.9–10.3)
Chloride: 104 mmol/L (ref 98–111)
Creatinine, Ser: 0.61 mg/dL (ref 0.44–1.00)
GFR, Estimated: >60 mL/min (ref >60)
Glucose, Bld: 113 mg/dL — ABNORMAL HIGH (ref 70–99)
Potassium: 3.5 mmol/L (ref 3.5–5.1)
Sodium: 140 mmol/L (ref 135–145)
Total Bilirubin: 0.7 mg/dL (ref 0.3–1.2)
Total Protein: 6.7 g/dL (ref 6.5–8.1)

## 2022-10-19 LAB — MAGNESIUM: Magnesium: 1.8 mg/dL (ref 1.7–2.4)

## 2022-10-19 LAB — LIPASE, BLOOD: Lipase: 32 U/L (ref 11–51)

## 2022-10-19 MED ORDER — MECLIZINE HCL 25 MG PO TABS: 25.0000 mg | ORAL_TABLET | Freq: Three times a day (TID) | ORAL | 0 refills | Status: DC | PRN

## 2022-10-19 MED ORDER — MECLIZINE HCL 25 MG PO TABS
25.0000 mg | ORAL_TABLET | Freq: Once | ORAL | Status: AC
  Administered 2022-10-19: 25 mg via ORAL
  Filled 2022-10-19: qty 1

## 2022-10-19 MED ORDER — ONDANSETRON 4 MG PO TBDP: 4.0000 mg | ORAL_TABLET | Freq: Three times a day (TID) | ORAL | 0 refills | Status: DC | PRN

## 2022-10-19 MED ORDER — IOHEXOL 350 MG/ML SOLN
75.0000 mL | Freq: Once | INTRAVENOUS | Status: AC | PRN
  Administered 2022-10-19: 75 mL via INTRAVENOUS

## 2022-10-19 MED ORDER — LACTATED RINGERS IV BOLUS
1000.0000 mL | Freq: Once | INTRAVENOUS | Status: AC
  Administered 2022-10-19: 1000 mL via INTRAVENOUS

## 2022-10-19 NOTE — ED Triage Notes (Signed)
Pt BIBA from home. Pt c/o fall where they hit their head at 1030 today, and dizziness with N/V that began after the fall. Pt states that it feels like the world is spinning.  Aox4  Given 4 mg Iv Zofran and 400 ml NS

## 2022-10-19 NOTE — ED Provider Notes (Signed)
EMERGENCY DEPARTMENT AT [facility] Provider Note   CSN: [id number] Arrival date & time: 10/19/22  1512     History {Add pertinent medical, surgical, social history, OB history to HPI:1} No chief complaint on file.   Antonina Deziel is a 72 y.o. female.  HPI     72 year old female with a history of hyperlipidemia, depression, hypothyroidism who presents with concern for fall out of bed and dizziness.  Rolled off of bed and hit head against the wall, hit shoulder against the wall.  Right shoulder really hurting. Took 2 acetaminophen. Then began sweating, dizziness. Entire house was spinning.  Sweating and nausea not sure what caused it.  Still dizzy now, but not sweating since arrival.  A little headache.  Nausea but no vomiting.  1030AM or 11 it started--the dizziness. 12PM took the acetaminphen and then was swating.   No chest pain. No dyspnea.  No abdominal pain.  One loose stool not black colored. No blood.  Last night had midnight snack with crackers and parmesan.   No fever.  Felt absolutely weak all over, not exactly lightheaded.   No focal weakness, numbness, change in vision, difficulty talking. Walked but felt weak walking.   No medication changes, didn't take any medications   Past Medical History:  Diagnosis Date   Allergic rhinitis    Allergy    seasonal   Anxiety    Depression    History of colonic polyps    Hyperlipemia    Hypothyroidism    Osteoporosis      Home Medications Prior to Admission medications   Medication Sig Start Date End Date Taking? Authorizing Provider  alendronate (FOSAMAX) 70 MG tablet TAKE 1 TABLET EVERY WEEK AS DIRECTED. SEE PACKAGE FOR ADDITONAL INSTRUCTIONS. NEEDS BONE DENSITY TEST. 10/11/20   Burchette, Elberta Fortis, MD  aspirin 81 MG tablet Take 81 mg by mouth daily.      [provider]  Calcium Carbonate (CALCIUM 600 PO) Take by mouth. Takes one tablet daily.    [provider]  Calcium  Citrate-Vitamin D (CITRACAL/VITAMIN D PO) Take by mouth. Takes 1 tablet daily. 500IU vitamin D and 630mg  calcium.    [provider]  DIPHENHYDRAMINE HCL, TOPICAL, (BENADRYL ITCH STOPPING) 2 % GEL Apply 1 application  topically 4 (four) times daily as needed. 07/16/22   Small, Brooke L, PA  divalproex (DEPAKOTE ER) 250 MG 24 hr tablet Take 250 mg by mouth. Three tabs daily    [provider]  levothyroxine (SYNTHROID) 88 MCG tablet TAKE 1 TABLET EVERY DAY Patient taking differently: 75 mcg. 09/10/19   Burchette, Elberta Fortis, MD  Multiple Vitamins-Minerals (WOMENS 50+ MULTI VITAMIN/MIN PO) Take by mouth daily.    [provider]  predniSONE (DELTASONE) 10 MG tablet Take 2 tablets (20 mg total) by mouth daily. 07/16/22   Small, Brooke L, PA  sertraline (ZOLOFT) 100 MG tablet Take 200 mg by mouth daily. Take 2 tablet bu mouth daily.    [provider]  simvastatin (ZOCOR) 20 MG tablet TAKE 1 TABLET AT BEDTIME 07/31/20   Burchette, Elberta Fortis, MD      Allergies    Patient has no known allergies.    Review of Systems   Review of Systems  Physical Exam Updated Vital Signs There were no vitals taken for this visit. Physical Exam Vitals and nursing note reviewed.  Constitutional:      General: She is not in acute distress.  Appearance: Normal appearance. She is well-developed. She is not ill-appearing or diaphoretic.  HENT:     Head: Normocephalic and atraumatic.  Eyes:     General: No visual field deficit.    Extraocular Movements: Extraocular movements intact.     Conjunctiva/sclera: Conjunctivae normal.     Pupils: Pupils are equal, round, and reactive to light.  Cardiovascular:     Rate and Rhythm: Normal rate and regular rhythm.     Pulses: Normal pulses.     Heart sounds: Normal heart sounds. No murmur heard.    No friction rub. No gallop.  Pulmonary:     Effort: Pulmonary effort is normal. No respiratory distress.     Breath sounds: Normal breath sounds.  No wheezing or rales.  Abdominal:     General: There is no distension.     Palpations: Abdomen is soft.     Tenderness: There is no abdominal tenderness. There is no guarding.  Musculoskeletal:        General: No swelling or tenderness.     Cervical back: Normal range of motion.  Skin:    General: Skin is warm and dry.     Findings: No erythema or rash.  Neurological:     General: No focal deficit present.     Mental Status: She is alert and oriented to person, place, and time.     GCS: GCS eye subscore is 4. GCS verbal subscore is 5. GCS motor subscore is 6.     Cranial Nerves: No cranial nerve deficit, dysarthria or facial asymmetry.     Sensory: No sensory deficit.     Motor: No weakness or tremor.     Coordination: Coordination normal. Finger-Nose-Finger Test normal.     Gait: Gait normal.     ED Results / Procedures / Treatments   Labs (all labs ordered are listed, but only abnormal results are displayed) Labs Reviewed - No data to display  EKG None  Radiology No results found.  Procedures Procedures  {Document cardiac monitor, telemetry assessment procedure when appropriate:1}  Medications Ordered in ED Medications - No data to display  ED Course/ Medical Decision Making/ A&P   {   Click here for ABCD2, HEART and other calculatorsREFRESH Note before signing :1}                               72 year old female with a history of hyperlipidemia, depression, hypothyroidism who presents with concern for fall out of bed and dizziness.  Differential diagnosis is broad and includes concussion, ICH, CVA, peripheral vertigo, other etiologies of lightheadedness with decreased blood pressures including dehydration, hemorrhage, electrolyte abnormalities, sepsis/infection.  She is out of the window of a code stroke, VAN negative.  Labs completed and personally about interpreted by me show no sign of UTI, no leukocytosis, mild anemia, CMP without clinically significant  abnormality.  She noted right-sided shoulder pain, troponin was ordered which was within normal limits and have low suspicion for ACS.  Given IV fluids, meclizine.  Given risk factors with room spinning dizziness, ordered CTA to evaluate for signs of ICH, occlusion.   CT evaluated by me without signs of ICH, ***   MRI brain ordered showing ***  {Document critical care time when appropriate:1} {Document review of labs and clinical decision tools ie heart score, Chads2Vasc2 etc:1}  {Document your independent review of radiology images, and any outside records:1} {Document your discussion with family members, caretakers,  and with consultants:1} {Document social determinants of health affecting pt's care:1} {Document your decision making why or why not admission, treatments were needed:1} Final Clinical Impression(s) / ED Diagnoses Final diagnoses:  None    Rx / DC Orders ED Discharge Orders     None

## 2022-10-21 ENCOUNTER — Other Ambulatory Visit: Payer: Self-pay | Admitting: Geriatric Medicine

## 2022-10-21 DIAGNOSIS — G3184 Mild cognitive impairment, so stated: Secondary | ICD-10-CM

## 2022-10-22 DIAGNOSIS — R42 Dizziness and giddiness: Secondary | ICD-10-CM | POA: Insufficient documentation

## 2022-10-28 ENCOUNTER — Ambulatory Visit
Admission: RE | Admit: 2022-10-28 | Discharge: 2022-10-28 | Disposition: A | Discharge status: Home / Self Care | Payer: Medicare HMO | Source: Ambulatory Visit | Attending: Adult Health

## 2022-10-28 DIAGNOSIS — M810 Age-related osteoporosis without current pathological fracture: Secondary | ICD-10-CM

## 2023-01-13 ENCOUNTER — Emergency Department (HOSPITAL_COMMUNITY)
Admission: EM | Admit: 2023-01-13 | Discharge: 2023-01-13 | Disposition: A | Discharge status: Home / Self Care | Payer: Medicare HMO | Attending: Emergency Medicine | Admitting: Emergency Medicine

## 2023-01-13 ENCOUNTER — Emergency Department (HOSPITAL_COMMUNITY): Payer: Medicare HMO

## 2023-01-13 ENCOUNTER — Other Ambulatory Visit: Payer: Self-pay

## 2023-01-13 DIAGNOSIS — Z79899 Other long term (current) drug therapy: Secondary | ICD-10-CM | POA: Diagnosis not present

## 2023-01-13 DIAGNOSIS — R42 Dizziness and giddiness: Secondary | ICD-10-CM | POA: Diagnosis present

## 2023-01-13 DIAGNOSIS — Z7982 Long term (current) use of aspirin: Secondary | ICD-10-CM | POA: Diagnosis not present

## 2023-01-13 DIAGNOSIS — E039 Hypothyroidism, unspecified: Secondary | ICD-10-CM | POA: Diagnosis not present

## 2023-01-13 LAB — CBC WITH DIFFERENTIAL/PLATELET
Abs Immature Granulocytes: 0.03 10*3/uL (ref 0.00–0.07)
Basophils Absolute: 0 10*3/uL (ref 0.0–0.1)
Basophils Relative: 0 %
Eosinophils Absolute: 0 10*3/uL (ref 0.0–0.5)
Eosinophils Relative: 1 %
HCT: 34.1 % — ABNORMAL LOW (ref 36.0–46.0)
Hemoglobin: 11.1 g/dL — ABNORMAL LOW (ref 12.0–15.0)
Immature Granulocytes: 1 %
Lymphocytes Relative: 21 %
Lymphs Abs: 1 10*3/uL (ref 0.7–4.0)
MCH: 29.3 pg (ref 26.0–34.0)
MCHC: 32.6 g/dL (ref 30.0–36.0)
MCV: 90 fL (ref 80.0–100.0)
Monocytes Absolute: 0.3 10*3/uL (ref 0.1–1.0)
Monocytes Relative: 7 %
Neutro Abs: 3.3 10*3/uL (ref 1.7–7.7)
Neutrophils Relative %: 70 %
Platelets: 146 10*3/uL — ABNORMAL LOW (ref 150–400)
RBC: 3.79 MIL/uL — ABNORMAL LOW (ref 3.87–5.11)
RDW: 13.9 % (ref 11.5–15.5)
WBC: 4.7 10*3/uL (ref 4.0–10.5)
nRBC: 0 % (ref 0.0–0.2)

## 2023-01-13 LAB — COMPREHENSIVE METABOLIC PANEL
ALT: 9 U/L (ref 0–44)
AST: 18 U/L (ref 15–41)
Albumin: 3.4 g/dL — ABNORMAL LOW (ref 3.5–5.0)
Alkaline Phosphatase: 70 U/L (ref 38–126)
Anion gap: 7 (ref 5–15)
BUN: 19 mg/dL (ref 8–23)
CO2: 27 mmol/L (ref 22–32)
Calcium: 8.7 mg/dL — ABNORMAL LOW (ref 8.9–10.3)
Chloride: 105 mmol/L (ref 98–111)
Creatinine, Ser: 0.66 mg/dL (ref 0.44–1.00)
GFR, Estimated: >60 mL/min (ref >60)
Glucose, Bld: 106 mg/dL — ABNORMAL HIGH (ref 70–99)
Potassium: 3.6 mmol/L (ref 3.5–5.1)
Sodium: 139 mmol/L (ref 135–145)
Total Bilirubin: 0.6 mg/dL (ref 0.0–1.2)
Total Protein: 6.5 g/dL (ref 6.5–8.1)

## 2023-01-13 LAB — TROPONIN I (HIGH SENSITIVITY)
Troponin I (High Sensitivity): 2 ng/L (ref <18)
Troponin I (High Sensitivity): 3 ng/L (ref <18)

## 2023-01-13 MED ORDER — MECLIZINE HCL 25 MG PO TABS
25.0000 mg | ORAL_TABLET | Freq: Once | ORAL | Status: AC
  Administered 2023-01-13: 25 mg via ORAL
  Filled 2023-01-13: qty 1

## 2023-01-13 MED ORDER — MECLIZINE HCL 25 MG PO TABS: 25.0000 mg | ORAL_TABLET | Freq: Three times a day (TID) | ORAL | 0 refills | Status: DC | PRN

## 2023-01-13 MED ORDER — SODIUM CHLORIDE 0.9 % IV BOLUS
500.0000 mL | Freq: Once | INTRAVENOUS | Status: AC
  Administered 2023-01-13: 500 mL via INTRAVENOUS

## 2023-01-13 NOTE — ED Provider Triage Note (Signed)
Emergency Medicine Provider Triage Evaluation Note  Alaze Hardin , a 72 y.o. female  was evaluated in triage.  Pt complains of acute onset vertigo today. No HA or tinnitus. This happened to her in October as well with ED visit and CT/MRI at that time. Reports it was due to concussion at that time.   Review of Systems  Positive: Vertigo Negative: Weakness/numbness   Physical Exam  BP (!) 98/50 (BP Location: Right Arm)   Pulse 70   Temp 97.8 F (36.6 C) (Oral)   Resp 18   SpO2 95%  Gen:   Awake, no distress   Resp:  Normal effort  MSK:   Moves extremities without difficulty  Other:  5/5 strength in the bilateral arms. Normal finger to nose.   Medical Decision Making  Medically screening exam initiated at 3:02 PM.  Appropriate orders placed.  Kerby Nora was informed that the remainder of the evaluation will be completed by another provider, this initial triage assessment does not replace that evaluation, and the importance of remaining in the ED until their evaluation is complete.  No focal neuro deficits to consider central vertigo cause. Patient with similar presentation in October and normal MRI at that time.    EKG Interpretation Date/Time:  Monday January 13 2023 14:54:50 EST Ventricular Rate:  73 PR Interval:  114 QRS Duration:  78 QT Interval:  432 QTC Calculation: 475 R Axis:   81  Text Interpretation: Normal sinus rhythm Nonspecific ST abnormality Abnormal ECG When compared with ECG of 19-Oct-2022 16:03, PREVIOUS ECG IS PRESENT Confirmed by Alona Bene [phone]) on 01/13/2023 3:05:08 PM          Nychelle Cassata, Arlyss Repress, MD 01/13/23 1505

## 2023-01-13 NOTE — ED Provider Notes (Signed)
EMERGENCY DEPARTMENT AT [facility] Provider Note   CSN: [id number] Arrival date & time: 01/13/23  1435     History  Chief Complaint  Patient presents with   Dizziness   Emesis    Tammy [name] is a 72 y.o. female.  Pt is a 72 yo female with pmhx significant for anxiety, depression, hld, hypothyroidism, and osteoporosis.  Pt said she was at lunch (around 1250) and suddenly developed n/v and dizziness.  She said she felt like she was going to die.  She was given 4 mg zofran by EMS and was given antivert in triage.  She waited for several hours to be seen and now feels back to normal.         Home Medications Prior to Admission medications   Medication Sig Start Date End Date Taking? Authorizing Provider  meclizine (ANTIVERT) 25 MG tablet Take 1 tablet (25 mg total) by mouth 3 (three) times daily as needed for dizziness. 01/13/23  Yes Jacalyn Lefevre, MD  alendronate (FOSAMAX) 70 MG tablet TAKE 1 TABLET EVERY WEEK AS DIRECTED. SEE PACKAGE FOR ADDITONAL INSTRUCTIONS. NEEDS BONE DENSITY TEST. 10/11/20   Burchette, Elberta Fortis, MD  aspirin 81 MG tablet Take 81 mg by mouth daily.      [provider]  Calcium Carbonate (CALCIUM 600 PO) Take by mouth. Takes one tablet daily.    [provider]  Calcium Citrate-Vitamin D (CITRACAL/VITAMIN D PO) Take by mouth. Takes 1 tablet daily. 500IU vitamin D and 630mg  calcium.    [provider]  DIPHENHYDRAMINE HCL, TOPICAL, (BENADRYL ITCH STOPPING) 2 % GEL Apply 1 application  topically 4 (four) times daily as needed. 07/16/22   Small, Brooke L, PA  divalproex (DEPAKOTE ER) 250 MG 24 hr tablet Take 250 mg by mouth. Three tabs daily    [provider]  levothyroxine (SYNTHROID) 88 MCG tablet TAKE 1 TABLET EVERY DAY Patient taking differently: 75 mcg. 09/10/19   Burchette, Elberta Fortis, MD  meclizine (ANTIVERT) 25 MG tablet Take 1 tablet (25 mg total) by mouth 3 (three) times daily as needed for  dizziness. 10/19/22   Alvira Monday, MD  Multiple Vitamins-Minerals (WOMENS 50+ MULTI VITAMIN/MIN PO) Take by mouth daily.    [provider]  ondansetron (ZOFRAN-ODT) 4 MG disintegrating tablet Take 1 tablet (4 mg total) by mouth every 8 (eight) hours as needed for nausea or vomiting. 10/19/22   Alvira Monday, MD  predniSONE (DELTASONE) 10 MG tablet Take 2 tablets (20 mg total) by mouth daily. 07/16/22   Small, Brooke L, PA  sertraline (ZOLOFT) 100 MG tablet Take 200 mg by mouth daily. Take 2 tablet bu mouth daily.    [provider]  simvastatin (ZOCOR) 20 MG tablet TAKE 1 TABLET AT BEDTIME 07/31/20   Burchette, Elberta Fortis, MD      Allergies    Patient has no known allergies.    Review of Systems   Review of Systems  Gastrointestinal:  Positive for vomiting.  Neurological:  Positive for dizziness.  All other systems reviewed and are negative.   Physical Exam Updated Vital Signs BP (!) 100/59 (BP Location: Right Arm)   Pulse 74   Temp 98.4 F (36.9 C) (Oral)   Resp 18   SpO2 96%  Physical Exam Vitals and nursing note reviewed.  Constitutional:      Appearance: Normal appearance.  HENT:     Head: Normocephalic and atraumatic.     Right Ear: External ear  normal.     Left Ear: External ear normal.     Nose: Nose normal.     Mouth/Throat:     Mouth: Mucous membranes are moist.     Pharynx: Oropharynx is clear.  Eyes:     Extraocular Movements: Extraocular movements intact.     Conjunctiva/sclera: Conjunctivae normal.     Pupils: Pupils are equal, round, and reactive to light.  Cardiovascular:     Rate and Rhythm: Normal rate and regular rhythm.     Pulses: Normal pulses.     Heart sounds: Normal heart sounds.  Pulmonary:     Effort: Pulmonary effort is normal.     Breath sounds: Normal breath sounds.  Abdominal:     General: Abdomen is flat. Bowel sounds are normal.     Palpations: Abdomen is soft.  Musculoskeletal:        General: Normal range of  motion.     Cervical back: Normal range of motion and neck supple.  Skin:    General: Skin is warm.     Capillary Refill: Capillary refill takes less than 2 seconds.  Neurological:     General: No focal deficit present.     Mental Status: She is alert and oriented to person, place, and time.  Psychiatric:        Mood and Affect: Mood normal.        Behavior: Behavior normal.     ED Results / Procedures / Treatments   Labs (all labs ordered are listed, but only abnormal results are displayed) Labs Reviewed  COMPREHENSIVE METABOLIC PANEL - Abnormal; Notable for the following components:      Result Value   Glucose, Bld 106 (*)    Calcium 8.7 (*)    Albumin 3.4 (*)    All other components within normal limits  CBC WITH DIFFERENTIAL/PLATELET - Abnormal; Notable for the following components:   RBC 3.79 (*)    Hemoglobin 11.1 (*)    HCT 34.1 (*)    Platelets 146 (*)    All other components within normal limits  TROPONIN I (HIGH SENSITIVITY)  TROPONIN I (HIGH SENSITIVITY)    EKG EKG Interpretation Date/Time:  Monday January 13 2023 14:54:50 EST Ventricular Rate:  73 PR Interval:  114 QRS Duration:  78 QT Interval:  432 QTC Calculation: 475 R Axis:   81  Text Interpretation: Normal sinus rhythm Nonspecific ST abnormality Abnormal ECG When compared with ECG of 19-Oct-2022 16:03, PREVIOUS ECG IS PRESENT Confirmed by Alona Bene [phone]) on 01/13/2023 3:05:08 PM  Radiology CT Head Wo Contrast Result Date: 01/13/2023 CLINICAL DATA:  Mental status change, unknown cause EXAM: CT HEAD WITHOUT CONTRAST TECHNIQUE: Contiguous axial images were obtained from the base of the skull through the vertex without intravenous contrast. RADIATION DOSE REDUCTION: This exam was performed according to the departmental dose-optimization program which includes automated exposure control, adjustment of the mA and/or kV according to patient size and/or use of iterative reconstruction technique.  COMPARISON:  CTA head/neck and MRI head October 19, 2022. FINDINGS: Brain: No evidence of acute large vascular territory infarction, hemorrhage, hydrocephalus, extra-axial collection or mass lesion/mass effect. Vascular: No hyperdense vessel identified. Calcific atherosclerosis. Skull: No acute fracture. Sinuses/Orbits: Clear sinuses.  No acute orbital findings. Other: No mastoid effusions. IMPRESSION: Stable head CT.  No evidence of acute intracranial abnormality. Electronically Signed   By: Feliberto Harts M.D.   On: 01/13/2023 17:53    Procedures Procedures    Medications Ordered in ED Medications  sodium chloride 0.9 % bolus 500 mL (0 mLs Intravenous Stopped 01/13/23 2155)  meclizine (ANTIVERT) tablet 25 mg (25 mg Oral Given 01/13/23 1519)    ED Course/ Medical Decision Making/ A&P                                 Medical Decision Making  This patient presents to the ED for concern of dizziness, this involves an extensive number of treatment options, and is a complaint that carries with it a high risk of complications and morbidity.  The differential diagnosis includes vertigo, cva, electrolyte abn   Co morbidities that complicate the patient evaluation   anxiety, depression, hld, hypothyroidism, and osteoporosis   Additional history obtained:  Additional history obtained from epic chart review External records from outside source obtained and reviewed including family   Lab Tests:  I Ordered, and personally interpreted labs.  The pertinent results include:  cbc with mild anemia (hgb 11.1 (stable)), cmp nl, trop nl   Imaging Studies ordered:  I ordered imaging studies including ct head  I independently visualized and interpreted imaging which showed Stable head CT.  No evidence of acute intracranial abnormality.  I agree with the radiologist interpretation   Medicines ordered and prescription drug management:  I ordered medication including meclizine  for dizziness   Reevaluation of the patient after these medicines showed that the patient improved I have reviewed the patients home medicines and have made adjustments as needed   Test Considered:  ct  Problem List / ED Course:  Vertigo:  sx have completely resolved.  She is much improved.  Bp sl low, but it is normal per pt.  At this time, labs/ct nl and she feels better.  No indication for mri.  She knows to return if worse.    Reevaluation:  After the interventions noted above, I reevaluated the patient and found that they have :improved   Social Determinants of Health:  Lives in independent living   Dispostion:  After consideration of the diagnostic results and the patients response to treatment, I feel that the patent would benefit from discharge with outpatient f/u.          Final Clinical Impression(s) / ED Diagnoses Final diagnoses:  Vertigo    Rx / DC Orders ED Discharge Orders          Ordered    meclizine (ANTIVERT) 25 MG tablet  3 times daily PRN        01/13/23 2208              Jacalyn Lefevre, MD 01/13/23 2327

## 2023-01-13 NOTE — ED Triage Notes (Signed)
Pt BIB EMS from assisted livigin. Around 1250 after lunch started to feel dizzy followed by 2-3 episodes of vomiting   20LAC  4mh zofran- no vomiting since medicated 500cc NS

## 2023-02-23 ENCOUNTER — Ambulatory Visit (HOSPITAL_COMMUNITY)
Admission: EM | Admit: 2023-02-23 | Discharge: 2023-02-23 | Disposition: A | Discharge status: Home / Self Care | Payer: Medicare HMO

## 2023-02-23 ENCOUNTER — Encounter (HOSPITAL_COMMUNITY): Payer: Self-pay | Admitting: Emergency Medicine

## 2023-02-23 DIAGNOSIS — J40 Bronchitis, not specified as acute or chronic: Secondary | ICD-10-CM

## 2023-02-23 DIAGNOSIS — J069 Acute upper respiratory infection, unspecified: Secondary | ICD-10-CM | POA: Diagnosis not present

## 2023-02-23 MED ORDER — IPRATROPIUM-ALBUTEROL 0.5-2.5 (3) MG/3ML IN SOLN
RESPIRATORY_TRACT | Status: AC
  Filled 2023-02-23: qty 3

## 2023-02-23 MED ORDER — AZITHROMYCIN 250 MG PO TABS: ORAL_TABLET | ORAL | 0 refills | Status: DC

## 2023-02-23 MED ORDER — PREDNISONE 20 MG PO TABS: ORAL_TABLET | ORAL | 0 refills | Status: DC

## 2023-02-23 MED ORDER — IPRATROPIUM-ALBUTEROL 0.5-2.5 (3) MG/3ML IN SOLN
3.0000 mL | Freq: Once | RESPIRATORY_TRACT | Status: AC
  Administered 2023-02-23: 3 mL via RESPIRATORY_TRACT

## 2023-02-23 NOTE — ED Triage Notes (Signed)
 Patient presents with c/o sore throat and productive cough x 5 days. Patient states she is using cough drops for her symptoms.

## 2023-02-23 NOTE — ED Provider Notes (Signed)
 [facility]    CSN: [id number] Arrival date & time: 02/23/23  1409      History   Chief Complaint Chief Complaint  Patient presents with   Sore Throat   Cough    HPI Tammy [name] is a 73 y.o. female.   HPI  She reports she has very sore throat  She states she has been feeling sick for about 5 days now She also reports dizziness, ear pain, rhinorrhea, predominantly dry coughing   She took OTC cough syrup once  She reports her companion was previously sick but is feeling better now    Past Medical History:  Diagnosis Date   Allergic rhinitis    Allergy    seasonal   Anxiety    Depression    History of colonic polyps    Hyperlipemia    Hypothyroidism    Osteoporosis     Patient Active Problem List   Diagnosis Date Noted   Bipolar disorder (HCC) 11/08/2013   UTI 08/30/2009   Hypothyroidism 11/24/2006   Hyperlipidemia 11/24/2006   Anxiety state 11/24/2006   DEPRESSION 11/24/2006   Allergic rhinitis 11/24/2006   Osteoporosis 11/24/2006   History of colonic polyps 11/24/2006    Past Surgical History:  Procedure Laterality Date   APPENDECTOMY     BREAST BIOPSY Right 1989   benign   CATARACT EXTRACTION BILATERAL W/ ANTERIOR VITRECTOMY Bilateral 2019   CESAREAN SECTION  1988   COLONOSCOPY  1998,2002,2007,2012   LOBECTOMY  1989   POLYPECTOMY  1998 only   hyperplastic    OB History   No obstetric history on file.      Home Medications    Prior to Admission medications   Medication Sig Start Date End Date Taking? Authorizing Provider  azithromycin  (ZITHROMAX ) 250 MG tablet Take 500mg  PO daily x1d and then 250mg  daily x4 days 02/23/23  Yes Alando Colleran E, PA-C  busPIRone (BUSPAR) 10 MG tablet Take 10 mg by mouth 2 (two) times daily. 10/01/22  Yes [provider]  omeprazole  (PRILOSEC) 40 MG capsule Take by mouth. 02/11/23  Yes [provider]  predniSONE  (DELTASONE ) 20 MG tablet Take 60mg  PO daily x 2 days, then40mg  PO  daily x 2 days, then 20mg  PO daily x 3 days 02/23/23  Yes Takumi Din E, PA-C  rosuvastatin  (CRESTOR ) 10 MG tablet Take 10 mg by mouth daily. 10/24/22  Yes [provider]  alendronate  (FOSAMAX ) 70 MG tablet TAKE 1 TABLET EVERY WEEK AS DIRECTED. SEE PACKAGE FOR ADDITONAL INSTRUCTIONS. NEEDS BONE DENSITY TEST. 10/11/20   Burchette, Wolm ORN, MD  aspirin 81 MG tablet Take 81 mg by mouth daily.      [provider]  Calcium  Carbonate (CALCIUM  600 PO) Take by mouth. Takes one tablet daily.    [provider]  Calcium  Citrate-Vitamin D  (CITRACAL/VITAMIN D  PO) Take by mouth. Takes 1 tablet daily. 500IU vitamin D  and 630mg  calcium .    [provider]  DIPHENHYDRAMINE  HCL, TOPICAL, (BENADRYL  ITCH STOPPING) 2 % GEL Apply 1 application  topically 4 (four) times daily as needed. 07/16/22   Small, Brooke L, PA  divalproex (DEPAKOTE ER) 250 MG 24 hr tablet Take 250 mg by mouth. Three tabs daily    [provider]  levothyroxine  (SYNTHROID ) 88 MCG tablet TAKE 1 TABLET EVERY DAY Patient taking differently: 75 mcg. 09/10/19   Burchette, Wolm ORN, MD  meclizine  (ANTIVERT ) 25 MG tablet Take 1 tablet (25 mg total) by mouth 3 (three) times  daily as needed for dizziness. 10/19/22   Dreama Longs, MD  meclizine  (ANTIVERT ) 25 MG tablet Take 1 tablet (25 mg total) by mouth 3 (three) times daily as needed for dizziness. 01/13/23   Dean Clarity, MD  Multiple Vitamins-Minerals (WOMENS 50+ MULTI VITAMIN/MIN PO) Take by mouth daily.    [provider]  ondansetron  (ZOFRAN -ODT) 4 MG disintegrating tablet Take 1 tablet (4 mg total) by mouth every 8 (eight) hours as needed for nausea or vomiting. 10/19/22   Dreama Longs, MD  predniSONE  (DELTASONE ) 10 MG tablet Take 2 tablets (20 mg total) by mouth daily. 07/16/22   Small, Brooke L, PA  sertraline (ZOLOFT) 100 MG tablet Take 200 mg by mouth daily. Take 2 tablet bu mouth daily.    [provider]    Family History Family  History  Problem Relation Age of Onset   Osteoporosis Mother    Hypothyroidism Mother    Gout Father    Hyperlipidemia Father    Cancer Father        Pancreatic Tumor   Breast cancer Maternal Aunt    Colon cancer Paternal Uncle    Colon cancer Maternal Grandfather    Colon cancer Cousin    Hyperlipidemia Other    Colon polyps Neg Hx    Esophageal cancer Neg Hx    Stomach cancer Neg Hx    Rectal cancer Neg Hx     Social History Social History   Tobacco Use   Smoking status: Never    Passive exposure: Past   Smokeless tobacco: Never  Vaping Use   Vaping status: Never Used  Substance Use Topics   Alcohol use: Not Currently    Comment: occasional red wine couple times a year   Drug use: Never     Allergies   Patient has no known allergies.   Review of Systems Review of Systems  Constitutional:  Positive for chills and fatigue. Negative for diaphoresis and fever.  HENT:  Positive for ear pain, postnasal drip, rhinorrhea and sore throat. Negative for congestion.   Respiratory:  Positive for cough. Negative for shortness of breath and wheezing.   Gastrointestinal:  Negative for diarrhea, nausea and vomiting.  Musculoskeletal:  Positive for myalgias.  Neurological:  Positive for dizziness. Negative for headaches.     Physical Exam Triage Vital Signs ED Triage Vitals  Encounter Vitals Group     BP 02/23/23 1544 119/78     Systolic BP Percentile --      Diastolic BP Percentile --      Pulse Rate 02/23/23 1544 90     Resp 02/23/23 1544 18     Temp 02/23/23 1544 98.2 F (36.8 C)     Temp Source 02/23/23 1544 Oral     SpO2 02/23/23 1544 94 %     Weight --      Height --      Head Circumference --      Peak Flow --      Pain Score 02/23/23 1546 9     Pain Loc --      Pain Education --      Exclude from Growth Chart --    No data found.  Updated Vital Signs BP 119/78 (BP Location: Left Arm)   Pulse 90   Temp 98.2 F (36.8 C) (Oral)   Resp 18   SpO2 94%    Visual Acuity Right Eye Distance:   Left Eye Distance:   Bilateral Distance:    Right  Eye Near:   Left Eye Near:    Bilateral Near:     Physical Exam Vitals reviewed.  Constitutional:      General: She is awake.     Appearance: Normal appearance. She is well-developed and well-groomed. She is ill-appearing.  HENT:     Head: Normocephalic and atraumatic.     Right Ear: Hearing, tympanic membrane and ear canal normal.     Left Ear: Hearing, tympanic membrane and ear canal normal.     Mouth/Throat:     Lips: Pink.     Mouth: Mucous membranes are moist.     Pharynx: Uvula midline. No pharyngeal swelling, oropharyngeal exudate, posterior oropharyngeal erythema, uvula swelling or postnasal drip.  Cardiovascular:     Rate and Rhythm: Normal rate and regular rhythm.     Heart sounds: Normal heart sounds.  Pulmonary:     Effort: Prolonged expiration present.     Breath sounds: Decreased air movement present. Wheezing and rhonchi present. No decreased breath sounds or rales.  Musculoskeletal:     Cervical back: Normal range of motion and neck supple.  Lymphadenopathy:     Head:     Right side of head: No submental, submandibular or preauricular adenopathy.     Left side of head: No submental, submandibular or preauricular adenopathy.     Cervical:     Right cervical: No superficial cervical adenopathy.    Left cervical: No superficial cervical adenopathy.     Upper Body:     Right upper body: No supraclavicular adenopathy.     Left upper body: No supraclavicular adenopathy.  Neurological:     Mental Status: She is alert.  Psychiatric:        Attention and Perception: Attention normal.        Mood and Affect: Mood normal.        Speech: Speech normal.        Behavior: Behavior normal. Behavior is cooperative.      UC Treatments / Results  Labs (all labs ordered are listed, but only abnormal results are displayed) Labs Reviewed - No data to  display  EKG   Radiology No results found.  Procedures Procedures (including critical care time)  Medications Ordered in UC Medications  ipratropium-albuterol  (DUONEB) 0.5-2.5 (3) MG/3ML nebulizer solution 3 mL (3 mLs Nebulization Given 02/23/23 1647)    Initial Impression / Assessment and Plan / UC Course  I have reviewed the triage vital signs and the nursing notes.  Pertinent labs & imaging results that were available during my care of the patient were reviewed by me and considered in my medical decision making (see chart for details).      Final Clinical Impressions(s) / UC Diagnoses   Final diagnoses:  Bronchitis  Viral upper respiratory tract infection   Acute, new concern Patient reports that she is having severe sore throat, postnasal drainage, cough, body aches for the past 5 days.  Symptoms appear consistent with likely URI of viral nature seeing as her fianc has had similar symptoms.  Of note she did have significant wheezing and rhonchi on physical exam.  These did improve significantly with DuoNeb treatment.  Will send in prednisone  taper, Z-Pak for symptom management.  Reviewed that she can continue to take over-the-counter medications such as Tylenol, Robitussin, Mucinex as needed for management.  ED and return precautions reviewed and provided in after visit summary.  Follow-up as needed for progressing or persistent symptoms   Discharge Instructions  At this time I suspect that you likely have an upper respiratory infection caused by a virus.  Management for this is usually symptomatic.  At this time I recommend taking Tylenol as needed for pain.  You can also use Robitussin and Mucinex for further symptom management.  I have sent in a prednisone  taper for you to take to help with your breathing.  I am also sending in a Z-Pak to further help with pulmonary inflammation as well as provide antibiotic coverage.  If at any point you start to have significant  shortness of breath, fevers, confusion, chest pain please go to the emergency room for further evaluation. I have sent in a script for Prednisone  taper to be taken in the morning with breakfast per the instructions on the container Remember that steroids can cause sleeplessness, irritability, increased hunger and elevated glucose levels so be mindful of these side effects. They should lessen as you progress to the lower doses of the taper.      ED Prescriptions     Medication Sig Dispense Auth. Provider   predniSONE  (DELTASONE ) 20 MG tablet Take 60mg  PO daily x 2 days, then40mg  PO daily x 2 days, then 20mg  PO daily x 3 days 13 tablet Jermika Olden E, PA-C   azithromycin  (ZITHROMAX ) 250 MG tablet Take 500mg  PO daily x1d and then 250mg  daily x4 days 6 each Mayah Urquidi E, PA-C      PDMP not reviewed this encounter.   Idil Maslanka, Rocky BRAVO, PA-C 02/23/23 1709

## 2023-02-23 NOTE — Discharge Instructions (Addendum)
 At this time I suspect that you likely have an upper respiratory infection caused by a virus.  Management for this is usually symptomatic.  At this time I recommend taking Tylenol as needed for pain.  You can also use Robitussin and Mucinex for further symptom management.  I have sent in a prednisone  taper for you to take to help with your breathing.  I am also sending in a Z-Pak to further help with pulmonary inflammation as well as provide antibiotic coverage.  If at any point you start to have significant shortness of breath, fevers, confusion, chest pain please go to the emergency room for further evaluation. I have sent in a script for Prednisone  taper to be taken in the morning with breakfast per the instructions on the container Remember that steroids can cause sleeplessness, irritability, increased hunger and elevated glucose levels so be mindful of these side effects. They should lessen as you progress to the lower doses of the taper.

## 2023-05-13 ENCOUNTER — Ambulatory Visit (INDEPENDENT_AMBULATORY_CARE_PROVIDER_SITE_OTHER): Payer: Medicare HMO | Admitting: Otolaryngology

## 2023-05-13 ENCOUNTER — Encounter (INDEPENDENT_AMBULATORY_CARE_PROVIDER_SITE_OTHER): Payer: Self-pay

## 2023-05-13 VITALS — BP 97/63 | HR 82 | Ht <= 58 in | Wt 105.0 lb

## 2023-05-13 DIAGNOSIS — J343 Hypertrophy of nasal turbinates: Secondary | ICD-10-CM | POA: Insufficient documentation

## 2023-05-13 DIAGNOSIS — J310 Chronic rhinitis: Secondary | ICD-10-CM | POA: Diagnosis not present

## 2023-05-13 DIAGNOSIS — R0982 Postnasal drip: Secondary | ICD-10-CM | POA: Insufficient documentation

## 2023-05-13 DIAGNOSIS — J342 Deviated nasal septum: Secondary | ICD-10-CM | POA: Insufficient documentation

## 2023-05-13 DIAGNOSIS — R0981 Nasal congestion: Secondary | ICD-10-CM

## 2023-05-13 MED ORDER — IPRATROPIUM BROMIDE 0.06 % NA SOLN: 2.0000 | Freq: Two times a day (BID) | NASAL | 12 refills | Status: AC | PRN

## 2023-05-13 NOTE — Progress Notes (Signed)
 Patient ID: Tammy [name], female   DOB: 06-24-1950, 73 y.o.   MRN: [id number]  Follow-up: Chronic postnasal drainage, chronic nasal congestion  HPI: The patient is a 73 year old female who returns today for her follow-up evaluation.  She was last seen 1 year ago.  At that time, she was complaining of chronic nasal congestion and nasal drainage.  She was noted to have nasal mucosal congestion, nasal septal deviation, and bilateral inferior turbinate hypertrophy.  She was treated with Atrovent and nasal saline irrigation.  The patient returns today reporting occasional nasal drainage and nasal congestion.  Her symptoms improved when she uses the medications.  She is currently on both Atrovent and Astepro as needed.  She denies any facial pain or fever.  Exam: General: Communicates without difficulty, well nourished, no acute distress. Head: Normocephalic, no evidence injury, no tenderness, facial buttresses intact without stepoff. Face/sinus: No tenderness to palpation and percussion. Facial movement is normal and symmetric. Eyes: PERRL, EOMI. No scleral icterus, conjunctivae clear. Neuro: CN II exam reveals vision grossly intact.  No nystagmus at any point of gaze. Ears: Auricles well formed without lesions.  Ear canals are intact without mass or lesion.  No erythema or edema is appreciated.  The TMs are intact without fluid. Nose: External evaluation reveals normal support and skin without lesions.  Dorsum is intact.  Anterior rhinoscopy reveals congested mucosa over anterior aspect of inferior turbinates and deviated septum.  No purulence noted. Oral:  Oral cavity and oropharynx are intact, symmetric, without erythema or edema.  Mucosa is moist without lesions. Neck: Full range of motion without pain.  There is no significant lymphadenopathy.  No masses palpable.  Thyroid  bed within normal limits to palpation.  Parotid glands and submandibular glands equal bilaterally without mass.  Trachea is  midline. Neuro:  CN 2-12 grossly intact.   Assessment: 1.  Chronic rhinitis with nasal mucosal congestion, nasal septal deviation, and bilateral inferior turbinate hypertrophy. 2.  Chronic postnasal drainage.  Plan: 1.  The physical exam findings are reviewed with the patient. 2.  Continue with Atrovent and Astepro as needed. 3.  A refill for Atrovent is given to the patient. 4.  The patient is encouraged to call with any questions or concerns.

## 2023-05-28 ENCOUNTER — Ambulatory Visit: Payer: Self-pay | Admitting: Sports Medicine

## 2023-06-11 ENCOUNTER — Ambulatory Visit: Payer: Self-pay | Admitting: Sports Medicine

## 2023-06-17 ENCOUNTER — Ambulatory Visit (INDEPENDENT_AMBULATORY_CARE_PROVIDER_SITE_OTHER): Payer: Self-pay | Admitting: Sports Medicine

## 2023-06-17 ENCOUNTER — Encounter: Payer: Self-pay | Admitting: Sports Medicine

## 2023-06-17 VITALS — BP 90/60 | HR 73 | Temp 97.7°F | Resp 12 | Ht <= 58 in | Wt 108.0 lb

## 2023-06-17 DIAGNOSIS — I959 Hypotension, unspecified: Secondary | ICD-10-CM | POA: Diagnosis not present

## 2023-06-17 DIAGNOSIS — Z131 Encounter for screening for diabetes mellitus: Secondary | ICD-10-CM

## 2023-06-17 DIAGNOSIS — R490 Dysphonia: Secondary | ICD-10-CM | POA: Diagnosis not present

## 2023-06-17 DIAGNOSIS — K219 Gastro-esophageal reflux disease without esophagitis: Secondary | ICD-10-CM | POA: Diagnosis not present

## 2023-06-17 DIAGNOSIS — E538 Deficiency of other specified B group vitamins: Secondary | ICD-10-CM

## 2023-06-17 DIAGNOSIS — R413 Other amnesia: Secondary | ICD-10-CM

## 2023-06-17 DIAGNOSIS — M810 Age-related osteoporosis without current pathological fracture: Secondary | ICD-10-CM

## 2023-06-17 DIAGNOSIS — E039 Hypothyroidism, unspecified: Secondary | ICD-10-CM

## 2023-06-17 DIAGNOSIS — R5383 Other fatigue: Secondary | ICD-10-CM

## 2023-06-17 DIAGNOSIS — E785 Hyperlipidemia, unspecified: Secondary | ICD-10-CM

## 2023-06-17 DIAGNOSIS — F3170 Bipolar disorder, currently in remission, most recent episode unspecified: Secondary | ICD-10-CM

## 2023-06-17 NOTE — Progress Notes (Signed)
 Careteam: Patient Care Team: Tye Gall, MD as PCP - General (Internal Medicine)  PLACE OF SERVICE:  [facility]  Advanced Directive information Does Patient Have a Medical Advance Directive?: Yes, Type of Advance Directive: Healthcare Power of [place];Living will, Does patient want to make changes to medical advance directive?: No - Patient declined  No Known Allergies  Chief Complaint  Patient presents with   Establish Care    New patient to establish care      Discussed the use of AI scribe software for clinical note transcription with the patient, who gave verbal consent to proceed.  History of Present Illness    Tammy Marksberry "ava" is a 73 year old female who presents for a new patient visit and medication review. She is accompanied by her husband, who is also a patient at the clinic.  She has a history of low blood pressure, typically around 90/60 mmHg, which often causes dizziness. She attributes the dizziness to her low blood pressure or possibly her medication, Divalproex, which she has been taking for mood stabilization for two to three years. No recent falls have been reported, but she did experience a fall in early October of the previous year, resulting in a concussion. Occasional memory issues, such as difficulty recalling names, are noted.  She has a history of hypothyroidism and is currently taking Synthroid  at a dose of 75 mcg daily. There are no recent thyroid  function tests available for review.  She experiences acid reflux and takes omeprazole for management. No blood in her stool or melena. She has not had an upper endoscopy.  She has a history of osteoporosis and was previously on Fosamax  but is currently on a drug holiday. A recent bone density test was performed, but the exact date is unclear.  She engages in regular physical activity, walking almost every day for 30 to 45 minutes, and performs exercises in her bedroom. No chest pain or  shortness of breath during these activities.  She has seasonal allergies and uses a nasal spray . She does not use meclizine  or baby aspirin and has no history of stroke or heart problems.  She takes buspirone for mood stabilization and reports that her depression has improved significantly since her recent marriage.  She has erratic sleep patterns, often sleeping until late in the morning and taking long naps during the day. She attributes some of her daytime sleepiness to occasional wine consumption.    She experiences fatigue on some days but denies any significant ongoing health issues.  Review of Systems:  Review of Systems  Constitutional:  Negative for fever.  HENT:  Negative for sore throat.   Respiratory:  Negative for cough, sputum production and shortness of breath.   Cardiovascular:  Negative for chest pain, palpitations and leg swelling.  Gastrointestinal:  Negative for abdominal pain and heartburn.  Genitourinary:  Negative for dysuria.  Musculoskeletal:  Negative for falls and myalgias.  Neurological:  Positive for dizziness.  Psychiatric/Behavioral:  Positive for depression. Negative for suicidal ideas.    Negative unless indicated in HPI.   Past Medical History:  Diagnosis Date   Allergic rhinitis    Allergy    seasonal   Anxiety    Depression    History of colonic polyps    Hyperlipemia    Hypothyroidism    Osteoporosis    Past Surgical History:  Procedure Laterality Date   APPENDECTOMY     BREAST BIOPSY Right 1989   benign  CATARACT EXTRACTION BILATERAL W/ ANTERIOR VITRECTOMY Bilateral 2019   CESAREAN SECTION  1988   COLONOSCOPY  1998,2002,2007,2012   LOBECTOMY  1989   POLYPECTOMY  1998 only   hyperplastic   Social History:   reports that she has never smoked. She has been exposed to tobacco smoke. She has never used smokeless tobacco. She reports that she does not currently use alcohol. She reports that she does not use drugs.  Family History   Problem Relation Age of Onset   Osteoporosis Mother    Hypothyroidism Mother    Gout Father    Hyperlipidemia Father    Cancer Father        Pancreatic Tumor   Breast cancer Maternal Aunt    Colon cancer Paternal Uncle    Colon cancer Maternal Grandfather    Colon cancer Cousin    Hyperlipidemia Other    Colon polyps Neg Hx    Esophageal cancer Neg Hx    Stomach cancer Neg Hx    Rectal cancer Neg Hx     Medications: Patient's Medications  New Prescriptions   No medications on file  Previous Medications   ALENDRONATE  (FOSAMAX ) 70 MG TABLET    TAKE 1 TABLET EVERY WEEK AS DIRECTED. SEE PACKAGE FOR ADDITONAL INSTRUCTIONS. NEEDS BONE DENSITY TEST.   ASPIRIN 81 MG TABLET    Take 81 mg by mouth daily.     AZITHROMYCIN  (ZITHROMAX ) 250 MG TABLET    Take 500mg  PO daily x1d and then 250mg  daily x4 days   BUSPIRONE (BUSPAR) 10 MG TABLET    Take 10 mg by mouth in the morning, at noon, in the evening, and at bedtime. 2 tablets in the morning, and 2 tablets at night.   CALCIUM CARBONATE (CALCIUM 600 PO)    Take by mouth. Takes one tablet daily.   CALCIUM CITRATE-VITAMIN D  (CITRACAL/VITAMIN D  PO)    Take 1 tablet by mouth daily. 650mg  Calcium 25mcg Vitamin D3   CALCIUM-VITAMIN D  PO    Take 1 tablet by mouth daily. 600mg  Calcium 20mcg Vitamin D3   DIPHENHYDRAMINE  HCL, TOPICAL, (BENADRYL  ITCH STOPPING) 2 % GEL    Apply 1 application  topically 4 (four) times daily as needed.   DIVALPROEX (DEPAKOTE ER) 250 MG 24 HR TABLET    Take 250 mg by mouth. Three tabs daily   IPRATROPIUM (ATROVENT ) 0.06 % NASAL SPRAY    Place 2 sprays into both nostrils 2 (two) times daily as needed.   LEVOTHYROXINE  (SYNTHROID ) 75 MCG TABLET    Take 75 mcg by mouth daily before breakfast.   LEVOTHYROXINE  (SYNTHROID ) 88 MCG TABLET    TAKE 1 TABLET EVERY DAY   MECLIZINE  (ANTIVERT ) 25 MG TABLET    Take 1 tablet (25 mg total) by mouth 3 (three) times daily as needed for dizziness.   MECLIZINE  (ANTIVERT ) 25 MG TABLET    Take 1  tablet (25 mg total) by mouth 3 (three) times daily as needed for dizziness.   MULTIPLE VITAMINS-MINERALS (AIRBORNE GUMMIES) CHEW    Chew 3 Pieces by mouth daily.   MULTIPLE VITAMINS-MINERALS (WOMENS 50+ MULTI VITAMIN/MIN PO)    Take by mouth daily.   OMEPRAZOLE (PRILOSEC) 40 MG CAPSULE    Take by mouth.   ONDANSETRON  (ZOFRAN -ODT) 4 MG DISINTEGRATING TABLET    Take 1 tablet (4 mg total) by mouth every 8 (eight) hours as needed for nausea or vomiting.   PREDNISONE  (DELTASONE ) 10 MG TABLET    Take 2 tablets (20 mg total) by  mouth daily.   PREDNISONE  (DELTASONE ) 20 MG TABLET    Take 60mg  PO daily x 2 days, then40mg  PO daily x 2 days, then 20mg  PO daily x 3 days   ROSUVASTATIN (CRESTOR) 10 MG TABLET    Take 10 mg by mouth daily.   SERTRALINE (ZOLOFT) 100 MG TABLET    Take 150 mg by mouth daily.  Modified Medications   No medications on file  Discontinued Medications   No medications on file    Physical Exam: Vitals:   06/12/23 1626  BP: 90/60  Pulse: 73  Resp: 12  Temp: 97.7 F (36.5 C)  TempSrc: Temporal  SpO2: 96%  Weight: 108 lb (49 kg)  Height: 4\' 10"  (1.473 m)   Body mass index is 22.57 kg/m. BP Readings from Last 3 Encounters:  06/12/23 90/60  05/13/23 97/63  02/23/23 119/78   Wt Readings from Last 3 Encounters:  06/12/23 108 lb (49 kg)  05/13/23 105 lb (47.6 kg)  10/19/22 112 lb (50.8 kg)    Physical Exam Constitutional:      Appearance: Normal appearance.  HENT:     Head: Normocephalic and atraumatic.     Right Ear: There is no impacted cerumen.     Left Ear: There is no impacted cerumen.  Cardiovascular:     Rate and Rhythm: Normal rate and regular rhythm.  Pulmonary:     Effort: Pulmonary effort is normal. No respiratory distress.     Breath sounds: Normal breath sounds. No wheezing.  Abdominal:     General: Bowel sounds are normal. There is no distension.     Tenderness: There is no abdominal tenderness. There is no guarding or rebound.     Comments:     Musculoskeletal:        General: No swelling or tenderness.  Neurological:     Mental Status: She is alert. Mental status is at baseline.     Motor: No weakness.     Labs reviewed: Basic Metabolic Panel: Recent Labs    10/19/22 1625 01/13/23 1501  NA 140 139  K 3.5 3.6  CL 104 105  CO2 27 27  GLUCOSE 113* 106*  BUN 22 19  CREATININE 0.61 0.66  CALCIUM 8.8* 8.7*  MG 1.8  --    Liver Function Tests: Recent Labs    10/19/22 1625 01/13/23 1501  AST 25 18  ALT 14 9  ALKPHOS 61 70  BILITOT 0.7 0.6  PROT 6.7 6.5  ALBUMIN 3.6 3.4*   Recent Labs    10/19/22 1625  LIPASE 32   No results for input(s): "AMMONIA" in the last 8760 hours. CBC: Recent Labs    10/19/22 1625 01/13/23 1501  WBC 5.1 4.7  NEUTROABS 4.1 3.3  HGB 11.4* 11.1*  HCT 34.8* 34.1*  MCV 91.6 90.0  PLT 152 146*   Lipid Panel: No results for input(s): "CHOL", "HDL", "LDLCALC", "TRIG", "CHOLHDL", "LDLDIRECT" in the last 8760 hours. TSH: No results for input(s): "TSH" in the last 8760 hours. A1C: No results found for: "HGBA1C"  Assessment and Plan Assessment & Plan  1. Hoarseness of voice (Primary)  2. Gastroesophageal reflux disease, unspecified whether esophagitis present  Avoid spicy foods Cont with PPI  Will need records from previous provider  3. Age related osteoporosis, unspecified pathological fracture presence   - Vitamin D , 25-hydroxy  4. Hypotension, unspecified hypotension type  Bp on the lower side Instructed to increase oral hydration - Cortisol  5. Bipolar disorder in full remission,  most recent episode unspecified type (HCC)  Stable Cont with depakote  6. Memory deficit  Will check labs Will do [place] at next visit   7. Acquired hypothyroidism   - TSH  8. Other fatigue   - CBC with Differential/Platelet - Complete Metabolic Panel with eGFR  9. Hyperlipidemia, unspecified hyperlipidemia type   - Lipid Panel  10. B12 deficiency   - Vitamin B12  11.  Screening for diabetes mellitus   - Hemoglobin A1C  Other orders - CALCIUM-VITAMIN D  PO; Take 1 tablet by mouth daily. 600mg  Calcium 20mcg Vitamin D3 - Multiple Vitamins-Minerals (AIRBORNE GUMMIES) CHEW; Chew 3 Pieces by mouth daily. - levothyroxine  (SYNTHROID ) 75 MCG tablet; Take 75 mcg by mouth daily before breakfast.

## 2023-06-23 ENCOUNTER — Ambulatory Visit: Payer: Self-pay | Admitting: Family

## 2023-06-27 ENCOUNTER — Other Ambulatory Visit

## 2023-06-30 ENCOUNTER — Ambulatory Visit (INDEPENDENT_AMBULATORY_CARE_PROVIDER_SITE_OTHER): Admitting: Family

## 2023-06-30 ENCOUNTER — Encounter: Payer: Self-pay | Admitting: Family

## 2023-06-30 VITALS — BP 100/70 | HR 71 | Temp 97.8°F | Resp 19 | Ht <= 58 in | Wt 107.6 lb

## 2023-06-30 DIAGNOSIS — Z0000 Encounter for general adult medical examination without abnormal findings: Secondary | ICD-10-CM

## 2023-06-30 LAB — CBC WITH DIFFERENTIAL/PLATELET
Absolute Lymphocytes: 1832 {cells}/uL (ref 850–3900)
Absolute Monocytes: 324 {cells}/uL (ref 200–950)
Basophils Absolute: 20 {cells}/uL (ref 0–200)
Basophils Relative: 0.5 %
Eosinophils Absolute: 232 {cells}/uL (ref 15–500)
Eosinophils Relative: 5.8 %
HCT: 37.7 % (ref 35.0–45.0)
Hemoglobin: 12 g/dL (ref 11.7–15.5)
MCH: 29.7 pg (ref 27.0–33.0)
MCHC: 31.8 g/dL — ABNORMAL LOW (ref 32.0–36.0)
MCV: 93.3 fL (ref 80.0–100.0)
MPV: 10.7 fL (ref 7.5–12.5)
Monocytes Relative: 8.1 %
Neutro Abs: 1592 {cells}/uL (ref 1500–7800)
Neutrophils Relative %: 39.8 %
Platelets: 162 10*3/uL (ref 140–400)
RBC: 4.04 10*6/uL (ref 3.80–5.10)
RDW: 13.6 % (ref 11.0–15.0)
Total Lymphocyte: 45.8 %
WBC: 4 10*3/uL (ref 3.8–10.8)

## 2023-06-30 LAB — COMPLETE METABOLIC PANEL WITHOUT GFR
AG Ratio: 1.5 (calc) (ref 1.0–2.5)
ALT: 11 U/L (ref 6–29)
AST: 20 U/L (ref 10–35)
Albumin: 4 g/dL (ref 3.6–5.1)
Alkaline phosphatase (APISO): 61 U/L (ref 37–153)
BUN: 23 mg/dL (ref 7–25)
CO2: 31 mmol/L (ref 20–32)
Calcium: 9.1 mg/dL (ref 8.6–10.4)
Chloride: 104 mmol/L (ref 98–110)
Creat: 0.7 mg/dL (ref 0.60–1.00)
Globulin: 2.7 g/dL (ref 1.9–3.7)
Glucose, Bld: 91 mg/dL (ref 65–99)
Potassium: 3.8 mmol/L (ref 3.5–5.3)
Sodium: 142 mmol/L (ref 135–146)
Total Bilirubin: 0.6 mg/dL (ref 0.2–1.2)
Total Protein: 6.7 g/dL (ref 6.1–8.1)

## 2023-06-30 LAB — LIPID PANEL
Cholesterol: 171 mg/dL (ref <200)
HDL: 65 mg/dL (ref >=50)
LDL Cholesterol (Calc): 90 mg/dL
Non-HDL Cholesterol (Calc): 106 mg/dL (ref <130)
Total CHOL/HDL Ratio: 2.6 (calc) (ref <5.0)
Triglycerides: 69 mg/dL (ref <150)

## 2023-06-30 LAB — VITAMIN B12: Vitamin B-12: 566 pg/mL (ref 200–1100)

## 2023-06-30 LAB — TSH: TSH: 0.11 m[IU]/L — ABNORMAL LOW (ref 0.40–4.50)

## 2023-06-30 LAB — HEMOGLOBIN A1C
Hgb A1c MFr Bld: 5.7 % — ABNORMAL HIGH (ref <5.7)
Mean Plasma Glucose: 117 mg/dL
eAG (mmol/L): 6.5 mmol/L

## 2023-06-30 LAB — VITAMIN D 25 HYDROXY (VIT D DEFICIENCY, FRACTURES): Vit D, 25-Hydroxy: 91 ng/mL (ref 30–100)

## 2023-06-30 LAB — CORTISOL: Cortisol, Plasma: 17.7 ug/dL

## 2023-06-30 NOTE — Patient Instructions (Signed)
 Tammy [name] , Thank you for taking time to come for your Medicare Wellness Visit. I appreciate your ongoing commitment to your health goals. Please review the following plan we discussed and let me know if I can assist you in the future.   Screening recommendations/referrals: Colonoscopy : N/A  Mammogram : Up to date  Bone Density : Up to date  Recommended yearly ophthalmology/optometry visit for glaucoma screening and checkup Recommended yearly dental visit for hygiene and checkup  Vaccinations: Influenza vaccine- due annually in September/October Pneumococcal vaccine : Up to date  Tdap vaccine : Up to date  Shingles vaccine : Up to date     Advanced directives: Yes   Conditions/risks identified: Cardiac Risk Factors include: advanced age (>85men, >6 women)  Next appointment: 1 year    Preventive Care 31 Years and Older, Female Preventive care refers to lifestyle choices and visits with your health care provider that can promote health and wellness. What does preventive care include? A yearly physical exam. This is also called an annual well check. Dental exams once or twice a year. Routine eye exams. Ask your health care provider how often you should have your eyes checked. Personal lifestyle choices, including: Daily care of your teeth and gums. Regular physical activity. Eating a healthy diet. Avoiding tobacco and drug use. Limiting alcohol use. Practicing safe sex. Taking low-dose aspirin every day. Taking vitamin and mineral supplements as recommended by your health care provider. What happens during an annual well check? The services and screenings done by your health care provider during your annual well check will depend on your age, overall health, lifestyle risk factors, and family history of disease. Counseling  Your health care provider may ask you questions about your: Alcohol use. Tobacco use. Drug use. Emotional well-being. Home and relationship  well-being. Sexual activity. Eating habits. History of falls. Memory and ability to understand (cognition). Work and work Astronomer. Reproductive health. Screening  You may have the following tests or measurements: Height, weight, and BMI. Blood pressure. Lipid and cholesterol levels. These may be checked every 5 years, or more frequently if you are over 75 years old. Skin check. Lung cancer screening. You may have this screening every year starting at age 84 if you have a 30-pack-year history of smoking and currently smoke or have quit within the past 15 years. Fecal occult blood test (FOBT) of the stool. You may have this test every year starting at age 41. Flexible sigmoidoscopy or colonoscopy. You may have a sigmoidoscopy every 5 years or a colonoscopy every 10 years starting at age 34. Hepatitis C blood test. Hepatitis B blood test. Sexually transmitted disease (STD) testing. Diabetes screening. This is done by checking your blood sugar (glucose) after you have not eaten for a while (fasting). You may have this done every 1-3 years. Bone density scan. This is done to screen for osteoporosis. You may have this done starting at age 65. Mammogram. This may be done every 1-2 years. Talk to your health care provider about how often you should have regular mammograms. Talk with your health care provider about your test results, treatment options, and if necessary, the need for more tests. Vaccines  Your health care provider may recommend certain vaccines, such as: Influenza vaccine. This is recommended every year. Tetanus, diphtheria, and acellular pertussis (Tdap, Td) vaccine. You may need a Td booster every 10 years. Zoster vaccine. You may need this after age 18. Pneumococcal 13-valent conjugate (PCV13) vaccine. One dose is recommended after  age 56. Pneumococcal polysaccharide (PPSV23) vaccine. One dose is recommended after age 81. Talk to your health care provider about which  screenings and vaccines you need and how often you need them. This information is not intended to replace advice given to you by your health care provider. Make sure you discuss any questions you have with your health care provider. Document Released: 01/27/2015 Document Revised: 09/20/2015 Document Reviewed: 11/01/2014 Elsevier Interactive Patient Education  2017 [employer].  Fall Prevention in the Home Falls can cause injuries. They can happen to people of all ages. There are many things you can do to make your home safe and to help prevent falls. What can I do on the outside of my home? Regularly fix the edges of walkways and driveways and fix any cracks. Remove anything that might make you trip as you walk through a door, such as a raised step or threshold. Trim any bushes or trees on the path to your home. Use bright outdoor lighting. Clear any walking paths of anything that might make someone trip, such as rocks or tools. Regularly check to see if handrails are loose or broken. Make sure that both sides of any steps have handrails. Any raised decks and porches should have guardrails on the edges. Have any leaves, snow, or ice cleared regularly. Use sand or salt on walking paths during winter. Clean up any spills in your garage right away. This includes oil or grease spills. What can I do in the bathroom? Use night lights. Install grab bars by the toilet and in the tub and shower. Do not use towel bars as grab bars. Use non-skid mats or decals in the tub or shower. If you need to sit down in the shower, use a plastic, non-slip stool. Keep the floor dry. Clean up any water that spills on the floor as soon as it happens. Remove soap buildup in the tub or shower regularly. Attach bath mats securely with double-sided non-slip rug tape. Do not have throw rugs and other things on the floor that can make you trip. What can I do in the bedroom? Use night lights. Make sure that you have a  light by your bed that is easy to reach. Do not use any sheets or blankets that are too big for your bed. They should not hang down onto the floor. Have a firm chair that has side arms. You can use this for support while you get dressed. Do not have throw rugs and other things on the floor that can make you trip. What can I do in the kitchen? Clean up any spills right away. Avoid walking on wet floors. Keep items that you use a lot in easy-to-reach places. If you need to reach something above you, use a strong step stool that has a grab bar. Keep electrical cords out of the way. Do not use floor polish or wax that makes floors slippery. If you must use wax, use non-skid floor wax. Do not have throw rugs and other things on the floor that can make you trip. What can I do with my stairs? Do not leave any items on the stairs. Make sure that there are handrails on both sides of the stairs and use them. Fix handrails that are broken or loose. Make sure that handrails are as long as the stairways. Check any carpeting to make sure that it is firmly attached to the stairs. Fix any carpet that is loose or worn. Avoid having throw rugs  at the top or bottom of the stairs. If you do have throw rugs, attach them to the floor with carpet tape. Make sure that you have a light switch at the top of the stairs and the bottom of the stairs. If you do not have them, ask someone to add them for you. What else can I do to help prevent falls? Wear shoes that: Do not have high heels. Have rubber bottoms. Are comfortable and fit you well. Are closed at the toe. Do not wear sandals. If you use a stepladder: Make sure that it is fully opened. Do not climb a closed stepladder. Make sure that both sides of the stepladder are locked into place. Ask someone to hold it for you, if possible. Clearly mark and make sure that you can see: Any grab bars or handrails. First and last steps. Where the edge of each step  is. Use tools that help you move around (mobility aids) if they are needed. These include: Canes. Walkers. Scooters. Crutches. Turn on the lights when you go into a dark area. Replace any light bulbs as soon as they burn out. Set up your furniture so you have a clear path. Avoid moving your furniture around. If any of your floors are uneven, fix them. If there are any pets around you, be aware of where they are. Review your medicines with your doctor. Some medicines can make you feel dizzy. This can increase your chance of falling. Ask your doctor what other things that you can do to help prevent falls. This information is not intended to replace advice given to you by your health care provider. Make sure you discuss any questions you have with your health care provider. Document Released: 10/27/2008 Document Revised: 06/08/2015 Document Reviewed: 02/04/2014 Elsevier Interactive Patient Education  2017 [employer].

## 2023-06-30 NOTE — Progress Notes (Signed)
 Subjective:   Tammy [name] is a 73 y.o. female who presents for Medicare Annual (Subsequent) preventive examination.  Visit Complete: In person  Patient Medicare AWV questionnaire was completed by the patient on 06/30/2023; I have confirmed that all information answered by patient is correct and no changes since this date.  Cardiac Risk Factors include: advanced age (>29men, >79 women)     Objective:    Today's Vitals   06/30/23 1005 06/30/23 1014  BP: 100/70   Pulse: 71   Resp: 19   Temp: 97.8 F (36.6 C)   SpO2: 97%   Weight: 107 lb 9.6 oz (48.8 kg)   Height: 4' 10 (1.473 m)   PainSc:  6    Body mass index is 22.49 kg/m.     06/30/2023    9:58 AM 06/17/2023   11:07 AM 02/23/2023    3:49 PM 10/19/2022    3:31 PM 07/15/2022   10:15 PM 10/29/2016    3:29 PM 08/11/2015    4:59 PM  Advanced Directives  Does Patient Have a Medical Advance Directive? Yes Yes No No No Yes  Yes   Type of Estate agent of [place];Living will Healthcare Power of [place];Living will       Does patient want to make changes to medical advance directive? No - Patient declined No - Patient declined       Copy of Healthcare Power of Attorney in Chart? No - copy requested No - copy requested       Would patient like information on creating a medical advance directive?     No - Patient declined       Data saved with a previous flowsheet row definition    Current Medications (verified) Outpatient Encounter Medications as of 06/30/2023  Medication Sig   busPIRone (BUSPAR) 10 MG tablet Take 10 mg by mouth in the morning, at noon, in the evening, and at bedtime. 2 tablets in the morning, and 2 tablets at night.   Calcium Citrate-Vitamin D  (CITRACAL/VITAMIN D  PO) Take 1 tablet by mouth daily. 650mg  Calcium 25mcg Vitamin D3   CALCIUM-VITAMIN D  PO Take 1 tablet by mouth daily. 600mg  Calcium 20mcg Vitamin D3   divalproex (DEPAKOTE ER) 250 MG 24 hr tablet Take 250 mg by mouth. Three  tabs daily   ipratropium (ATROVENT ) 0.06 % nasal spray Place 2 sprays into both nostrils 2 (two) times daily as needed.   levothyroxine  (SYNTHROID ) 75 MCG tablet Take 75 mcg by mouth daily before breakfast.   Multiple Vitamins-Minerals (AIRBORNE GUMMIES) CHEW Chew 3 Pieces by mouth daily.   Multiple Vitamins-Minerals (WOMENS 50+ MULTI VITAMIN/MIN PO) Take by mouth daily.   omeprazole (PRILOSEC) 40 MG capsule Take by mouth.   rosuvastatin (CRESTOR) 10 MG tablet Take 10 mg by mouth daily.   sertraline (ZOLOFT) 100 MG tablet Take 150 mg by mouth daily.   DIPHENHYDRAMINE  HCL, TOPICAL, (BENADRYL  ITCH STOPPING) 2 % GEL Apply 1 application  topically 4 (four) times daily as needed.   No facility-administered encounter medications on file as of 06/30/2023.    Allergies (verified) Patient has no known allergies.   History: Past Medical History:  Diagnosis Date   Allergic rhinitis    Allergy    seasonal   Anxiety    Depression    History of colonic polyps    Hyperlipemia    Hypothyroidism    Osteoporosis    Past Surgical History:  Procedure Laterality Date   APPENDECTOMY  BREAST BIOPSY Right 1989   benign   CATARACT EXTRACTION BILATERAL W/ ANTERIOR VITRECTOMY Bilateral 2019   CESAREAN SECTION  1988   COLONOSCOPY  1998,2002,2007,2012   LOBECTOMY  1989   POLYPECTOMY  1998 only   hyperplastic   Family History  Problem Relation Age of Onset   Osteoporosis Mother    Hypothyroidism Mother    Gout Father    Hyperlipidemia Father    Cancer Father        Pancreatic Tumor   Breast cancer Maternal Aunt    Colon cancer Paternal Uncle    Colon cancer Maternal Grandfather    Colon cancer Cousin    Hyperlipidemia Other    Colon polyps Neg Hx    Esophageal cancer Neg Hx    Stomach cancer Neg Hx    Rectal cancer Neg Hx    Social History   Socioeconomic History   Marital status: Divorced    Spouse name: Not on file   Number of children: Not on file   Years of education: Not on  file   Highest education level: Not on file  Occupational History   Occupation: unemployed  Tobacco Use   Smoking status: Never    Passive exposure: Past   Smokeless tobacco: Never  Vaping Use   Vaping status: Never Used  Substance and Sexual Activity   Alcohol use: Not Currently    Comment: occasional red wine couple times a year   Drug use: Never   Sexual activity: Yes  Other Topics Concern   Not on file  Social History Narrative   Single, presently unemployed without health insurance   Social Drivers of Health   Financial Resource Strain: Not on file  Food Insecurity: No Food Insecurity (06/17/2023)   Hunger Vital Sign    Worried About Running Out of Food in the Last Year: Never true    Ran Out of Food in the Last Year: Never true  Transportation Needs: No Transportation Needs (06/17/2023)   PRAPARE - Administrator, Civil Service (Medical): No    Lack of Transportation (Non-Medical): No  Physical Activity: Not on file  Stress: Not on file  Social Connections: Moderately Integrated (06/17/2023)   Social Connection and Isolation Panel    Frequency of Communication with Friends and Family: More than three times a week    Frequency of Social Gatherings with Friends and Family: More than three times a week    Attends Religious Services: More than 4 times per year    Active Member of [employer] or Organizations: No    Attends Engineer, structural: Never    Marital Status: Married    Tobacco Counseling Counseling given: Not Answered   Clinical Intake:  Pre-visit preparation completed: No  Pain : 0-10 Pain Score: 6  Pain Type: Chronic pain Pain Location: Back Pain Orientation: Lower Pain Radiating Towards: No Pain Descriptors / Indicators: Aching Pain Onset: More than a month ago Pain Frequency: Constant Pain Relieving Factors: separating pillow,Rest Effect of Pain on Daily Activities: lifting ,sleeping  Pain Relieving Factors: separating  pillow,Rest  BMI - recorded: 22.49  How often do you need to have someone help you when you read instructions, pamphlets, or other written materials from your doctor or pharmacy?: 1 - Never What is the last grade level you completed in school?: Bachelors Degree  Interpreter Needed?: No      Activities of Daily Living    06/30/2023   10:18 AM  In your present  state of health, do you have any difficulty performing the following activities:  Hearing? 0  Vision? 0  Difficulty concentrating or making decisions? 0  Walking or climbing stairs? 0  Dressing or bathing? 0  Doing errands, shopping? 0  Preparing Food and eating ? N  Using the Toilet? N  In the past six months, have you accidently leaked urine? N  Do you have problems with loss of bowel control? N  Managing your Medications? N  Managing your Finances? N  Housekeeping or managing your Housekeeping? N    Patient Care Team: Tye Gall, MD as PCP - General (Internal Medicine)  Indicate any recent Medical Services you may have received from other than [facility] providers in the past year (date may be approximate).     Assessment:   This is a routine wellness examination for Logan.  Hearing/Vision screen Hearing Screening - Comments:: Pt wear hearing aids Vision Screening - Comments:: Eye exam no issues (Dr.John Scott)   Goals Addressed             This Visit's Progress    patient   On track    [place]; from 1 to 4pm        Depression Screen    06/30/2023   10:00 AM 06/17/2023   11:28 AM 10/29/2016    3:31 PM 08/11/2015    5:10 PM 11/09/2014    1:38 PM 05/05/2013    1:51 PM  PHQ 2/9 Scores  PHQ - 2 Score 0 1 0 0 1 0    Fall Risk    06/30/2023   10:00 AM 06/17/2023   11:28 AM 06/17/2023   11:07 AM 08/04/2019   10:10 AM 11/03/2017    9:47 AM  Fall Risk   Falls in the past year? 0 0 1 0 No   Number falls in past yr: 0 0 0 0   Injury with Fall? 0 1 1 0    Risk for fall due to : Impaired balance/gait  Impaired balance/gait    Follow up Falls evaluation completed  Falls evaluation completed;Education provided;Falls prevention discussed Falls evaluation completed       Data saved with a previous flowsheet row definition    MEDICARE RISK AT HOME: Medicare Risk at Home Any stairs in or around the home?: Yes If so, are there any without handrails?: Yes Home free of loose throw rugs in walkways, pet beds, electrical cords, etc?: No Adequate lighting in your home to reduce risk of falls?: Yes Life alert?: No Use of a cane, walker or w/c?: No Grab bars in the bathroom?: No Shower chair or bench in shower?: No Elevated toilet seat or a handicapped toilet?: No  TIMED UP AND GO:  Was the test performed?  Yes  Length of time to ambulate 10 feet: 5 sec Gait steady and fast without use of assistive device    Cognitive Function:    10/29/2016    3:33 PM 08/11/2015    5:11 PM  MMSE - Mini Mental State Exam  Not completed: -- --        06/30/2023   10:00 AM  6CIT Screen  What Year? 0 points  What month? 0 points  What time? 0 points  Count back from 20 0 points  Months in reverse 0 points  Repeat phrase 0 points  Total Score 0 points    Immunizations Immunization History  Administered Date(s) Administered  Fluad Quad(high Dose 65+) 09/17/2018, 11/04/2019   Influenza Split 10/30/2013   Influenza, High Dose Seasonal PF 11/03/2017   Influenza-Unspecified 10/13/2015, 10/11/2016   Moderna Covid-19 Fall Seasonal Vaccine 38yrs & older 10/18/2022   PFIZER(Purple Top)SARS-COV-2 Vaccination 04/15/2019, 05/06/2019, 11/05/2019   Pneumococcal Conjugate-13 11/09/2014   Pneumococcal Polysaccharide-23 10/29/2016   Td 07/14/2007   Tdap 08/07/2017   Zoster Recombinant(Shingrix) 05/01/2022   Zoster, Live 04/06/2014    TDAP status: Up to date  Flu Vaccine status: Up to date  Pneumococcal vaccine status: Up to date  Covid-19 vaccine  status: Information provided on how to obtain vaccines.   Qualifies for Shingles Vaccine? Yes   Zostavax completed No   Shingrix Completed?: No.    Education has been provided regarding the importance of this vaccine. Patient has been advised to call insurance company to determine out of pocket expense if they have not yet received this vaccine. Advised may also receive vaccine at local pharmacy or Health Dept. Verbalized acceptance and understanding.  Screening Tests Health Maintenance  Topic Date Due   Zoster Vaccines- Shingrix (2 of 2) 06/26/2022   COVID-19 Vaccine (5 - 2024-25 season) 04/18/2023   INFLUENZA VACCINE  08/15/2023   Medicare Annual Wellness (AWV)  06/29/2024   MAMMOGRAM  09/10/2024   DTaP/Tdap/Td (3 - Td or Tdap) 08/08/2027   Pneumococcal Vaccine: 50+ Years  Completed   DEXA SCAN  Completed   Hepatitis C Screening  Completed   HPV VACCINES  Aged Out   Meningococcal B Vaccine  Aged Out   Colonoscopy  Discontinued    Health Maintenance  Health Maintenance Due  Topic Date Due   Zoster Vaccines- Shingrix (2 of 2) 06/26/2022   COVID-19 Vaccine (5 - 2024-25 season) 04/18/2023    Colorectal cancer screening: No longer required.   Mammogram status: Completed 09/11/2022. Repeat every year  Bone Density status: Completed 10/28/2022 . Results reflect: Bone density results: OSTEOPENIA. Repeat every 5 years.  Lung Cancer Screening: (Low Dose CT Chest recommended if Age 75-80 years, 20 pack-year currently smoking OR have quit w/in 15years.) does not qualify.   Lung Cancer Screening Referral: N/A   Additional Screening:  Hepatitis C Screening: does qualify; Completed yes   Vision Screening: Recommended annual ophthalmology exams for early detection of glaucoma and other disorders of the eye. Is the patient up to date with their annual eye exam?  Yes  Who is the provider or what is the name of the office in which the patient attends annual eye exams? Dr.Joan Scott  If  pt is not established with a provider, would they like to be referred to a provider to establish care? No .   Dental Screening: Recommended annual dental exams for proper oral hygiene  Diabetic Foot Exam: Diabetic Foot Exam: Completed N/A   Community Resource Referral / Chronic Care Management: CRR required this visit?  No   CCM required this visit?  No     Plan:     I have personally reviewed and noted the following in the patient's chart:   Medical and social history Use of alcohol, tobacco or illicit drugs  Current medications and supplements including opioid prescriptions. Patient is not currently taking opioid prescriptions. Functional ability and status Nutritional status Physical activity Advanced directives List of other physicians Hospitalizations, surgeries, and ER visits in previous 12 months Vitals Screenings to include cognitive, depression, and falls Referrals and appointments  In addition, I have reviewed and discussed with patient certain preventive protocols, quality metrics, and  best practice recommendations. A written personalized care plan for preventive services as well as general preventive health recommendations were provided to patient.     Estil Heman, NP   06/30/2023   After Visit Summary: (In Person-Printed) AVS printed and given to the patient  Nurse Notes: advised to obtain shingles vaccine record from pharmacy.

## 2023-07-01 ENCOUNTER — Ambulatory Visit: Payer: Self-pay | Admitting: Sports Medicine

## 2023-07-01 DIAGNOSIS — E039 Hypothyroidism, unspecified: Secondary | ICD-10-CM

## 2023-07-01 MED ORDER — LEVOTHYROXINE SODIUM 50 MCG PO TABS
50.0000 ug | ORAL_TABLET | Freq: Every day | ORAL | 3 refills | Status: DC
Start: 2023-07-01 — End: 2023-07-02

## 2023-07-02 MED ORDER — LEVOTHYROXINE SODIUM 50 MCG PO TABS: 50.0000 ug | ORAL_TABLET | Freq: Every day | ORAL | 3 refills | Status: DC

## 2023-07-02 NOTE — Telephone Encounter (Signed)
 Patient has been scheduled for TSH lab appointment 08/14/2023. Medication Levothyroxine  sent into preferred pharmacy for patient.

## 2023-07-02 NOTE — Telephone Encounter (Signed)
 Copied from CRM [id number]. Topic: Clinical - Prescription Issue >> Jul 02, 2023  4:11 PM Brittney F wrote: Reason for CRM:   Patient is calling after missing a call from a CMA regarding her lab results; Specialist relayed lab results; Patient has no questions about lab results at this time; Patient stated she would like her prescription for levothyroxine  (SYNTHROID ) 50 MCG tablet resent to her preferred pharmacy listed below.     Preferred Pharmacy :   [id number] [place], [place] - [address] [place] [address] Phone: [phone] Fax: [phone] Hours: Not open 24 hours   Callback Number: [id number]

## 2023-08-12 ENCOUNTER — Other Ambulatory Visit: Payer: Self-pay | Admitting: Sports Medicine

## 2023-08-12 MED ORDER — OMEPRAZOLE 40 MG PO CPDR: 40.0000 mg | DELAYED_RELEASE_CAPSULE | Freq: Every day | ORAL | 1 refills | Status: DC

## 2023-08-12 NOTE — Telephone Encounter (Signed)
 Copied from CRM [id number]. Topic: Clinical - Medication Refill >> Aug 12, 2023 10:45 AM Carmell R wrote: Medication: omeprazole  (PRILOSEC) 40 MG capsule  Has the patient contacted their pharmacy? No, was written by a different provider  This is the patient's preferred pharmacy:  [id number] [place], [place] - [address] [place] [address] Phone: [phone] Fax: [phone]  Is this the correct pharmacy for this prescription? Yes If no, delete pharmacy and type the correct one.   Has the prescription been filled recently? No  Is the patient out of the medication? Yes  Has the patient been seen for an appointment in the last year OR does the patient have an upcoming appointment? Yes  Can we respond through MyChart? Yes  Agent: Please be advised that Rx refills may take up to 3 business days. We ask that you follow-up with your pharmacy.

## 2023-08-13 ENCOUNTER — Ambulatory Visit: Payer: Self-pay

## 2023-08-13 NOTE — Telephone Encounter (Signed)
  FYI Only or Action Required?: FYI only for provider.  Patient was last seen in primary care on 06/30/2023 by Ngetich, Roxan BROCKS, NP.  Called Nurse Triage reporting Diarrhea.  Symptoms began several weeks ago.  Interventions attempted: Rest, hydration, or home remedies and Other: dietary changes.  Symptoms are: gradually improving.  Triage Disposition: See PCP Within 2 Weeks  Patient/caregiver understands and will follow disposition?: Yes Message from [place] sent at 08/13/2023 11:56 AM EDT  The patient is calling to schedule an appointment do to experiencing loose stool and some incontinence (bowel movement wise). The patient stated the symptoms are new.  Callback Number: [id number]   Reason for Disposition  Diarrhea is a chronic symptom (recurrent or ongoing AND present > 4 weeks)  Answer Assessment - Initial Assessment Questions Patient states that she has diarrhea when consuming salad and fruit at the same time for the past three weeks.  She believes that the high fiber foods cause her urgency.  She has adjusted her diet and this has helped with symptoms  1. DIARRHEA SEVERITY: How bad is the diarrhea? How many more stools have you had in the past 24 hours than normal?      No diarrhea today 2. ONSET: When did the diarrhea begin?      Three weeks 3. STOOL DESCRIPTION:  How loose or watery is the diarrhea? What is the stool color? Is there any blood or mucous in the stool?     Sometimes loose stools, sometimes watery  4. VOMITING: Are you also vomiting? If Yes, ask: How many times in the past 24 hours?      Denies, but  some queasiness 5. ABDOMEN PAIN: Are you having any abdomen pain? If Yes, ask: What does it feel like? (e.g., crampy, dull, intermittent, constant)      Very little  7. ORAL INTAKE: If vomiting, Have you been able to drink liquids? How much liquids have you had in the past 24 hours?     Denies any difficulty consuming fluids 8.  HYDRATION: Any signs of dehydration? (e.g., dry mouth [not just dry lips], too weak to stand, dizziness, new weight loss) When did you last urinate?     None, denies 9. EXPOSURE: Have you traveled to a foreign country recently? Have you been exposed to anyone with diarrhea? Could you have eaten any food that was spoiled?     denies 10. ANTIBIOTIC USE: Are you taking antibiotics now or have you taken antibiotics in the past 2 months?       denies 11. OTHER SYMPTOMS: Do you have any other symptoms? (e.g., fever, blood in stool)       denies 12. PREGNANCY: Is there any chance you are pregnant? When was your last menstrual period?       N/A  Protocols used: [facility]

## 2023-08-14 ENCOUNTER — Other Ambulatory Visit

## 2023-08-23 ENCOUNTER — Ambulatory Visit (HOSPITAL_COMMUNITY)
Admission: EM | Admit: 2023-08-23 | Discharge: 2023-08-23 | Disposition: A | Discharge status: Home / Self Care | Attending: Internal Medicine | Admitting: Internal Medicine

## 2023-08-23 ENCOUNTER — Encounter (HOSPITAL_COMMUNITY): Payer: Self-pay

## 2023-08-23 DIAGNOSIS — N3001 Acute cystitis with hematuria: Secondary | ICD-10-CM

## 2023-08-23 LAB — POCT URINALYSIS DIP (MANUAL ENTRY)
Bilirubin, UA: NEGATIVE
Glucose, UA: NEGATIVE mg/dL
Nitrite, UA: NEGATIVE
Protein Ur, POC: 100 mg/dL — AB
Spec Grav, UA: 1.02 (ref 1.010–1.025)
Urobilinogen, UA: 0.2 U/dL
pH, UA: 6 (ref 5.0–8.0)

## 2023-08-23 MED ORDER — SULFAMETHOXAZOLE-TRIMETHOPRIM 800-160 MG PO TABS: 1.0000 | ORAL_TABLET | Freq: Two times a day (BID) | ORAL | 0 refills | Status: AC

## 2023-08-23 NOTE — Discharge Instructions (Addendum)
 Symptoms and physical exam findings are consistent with an urinary tract infection.  Urinalysis done today is also consistent with this.  We will send the urine for culture to ensure that the antibiotic will be adequate.  If any changes are needed in the antibiotics we will contact you.  We will treat with the following: Bactrim  DS 800/160 mg twice daily for 5 days. This is an antibiotic.  Make sure to stay hydrated by drinking plenty of water.  Consider urology referral if urinary tract infections are recurrent Return to urgent care or PCP if symptoms worsen or fail to resolve.

## 2023-08-23 NOTE — ED Provider Notes (Signed)
[facility]    CSN: [id number] Arrival date & time: 08/23/23  1014      History   Chief Complaint No chief complaint on file.   HPI Tammy [name] is a 73 y.o. female.   73 year old female who presents to urgent care with complaints of dysuria and hematuria.  The symptoms have been going on for about 5 days.  She reports that she does get urinary tract infections about 2-3 times yearly.  She has never seen urology for this.  She denies any fevers, chills, abdominal pain, nausea, vomiting, back pain.     Past Medical History:  Diagnosis Date   Allergic rhinitis    Allergy    seasonal   Anxiety    Depression    History of colonic polyps    Hyperlipemia    Hypothyroidism    Osteoporosis     Patient Active Problem List   Diagnosis Date Noted   Chronic rhinitis 05/13/2023   Deviated nasal septum 05/13/2023   Hypertrophy of nasal turbinates 05/13/2023   Postnasal drip 05/13/2023   Vertigo 10/22/2022   Gastroesophageal reflux disease 03/04/2022   Memory impairment 01/12/2021   Leukopenia 06/26/2020   Recurrent major depression in remission (HCC) 11/18/2019   Bipolar disorder (HCC) 11/08/2013   UTI 08/30/2009   Hypothyroidism 11/24/2006   Hyperlipidemia 11/24/2006   Anxiety state 11/24/2006   DEPRESSION 11/24/2006   Allergic rhinitis 11/24/2006   Osteoporosis 11/24/2006   History of colonic polyps 11/24/2006    Past Surgical History:  Procedure Laterality Date   APPENDECTOMY     BREAST BIOPSY Right 1989   benign   CATARACT EXTRACTION BILATERAL W/ ANTERIOR VITRECTOMY Bilateral 2019   CESAREAN SECTION  1988   COLONOSCOPY  1998,2002,2007,2012   LOBECTOMY  1989   POLYPECTOMY  1998 only   hyperplastic    OB History   No obstetric history on file.      Home Medications    Prior to Admission medications   Medication Sig Start Date End Date Taking? Authorizing Provider  busPIRone (BUSPAR) 10 MG tablet Take 10 mg by mouth in the morning,  at noon, in the evening, and at bedtime. 2 tablets in the morning, and 2 tablets at night. 10/01/22  Yes [provider]  Calcium Citrate-Vitamin D  (CITRACAL/VITAMIN D  PO) Take 1 tablet by mouth daily. 650mg  Calcium 25mcg Vitamin D3   Yes [provider]  CALCIUM-VITAMIN D  PO Take 1 tablet by mouth daily. 600mg  Calcium 20mcg Vitamin D3   Yes [provider]  divalproex (DEPAKOTE ER) 250 MG 24 hr tablet Take 250 mg by mouth. Three tabs daily   Yes [provider]  levothyroxine  (SYNTHROID ) 50 MCG tablet Take 1 tablet (50 mcg total) by mouth daily. 07/02/23  Yes [name], MD  Multiple Vitamins-Minerals (AIRBORNE GUMMIES) CHEW Chew 3 Pieces by mouth daily.   Yes [provider]  Multiple Vitamins-Minerals (WOMENS 50+ MULTI VITAMIN/MIN PO) Take by mouth daily.   Yes [provider]  omeprazole  (PRILOSEC) 40 MG capsule Take 1 capsule (40 mg total) by mouth daily. 08/12/23  Yes Veludandi, Prashanthi, MD  rosuvastatin (CRESTOR) 10 MG tablet Take 10 mg by mouth daily. 10/24/22  Yes [provider]  sertraline (ZOLOFT) 100 MG tablet Take 150 mg by mouth daily.   Yes [provider]  sulfamethoxazole -trimethoprim  (BACTRIM  DS) 800-160 MG tablet Take 1 tablet by mouth 2 (two) times daily for 5 days. 08/23/23 08/28/23 Yes Teresa Almarie LABOR, PA-C  ipratropium (ATROVENT ) 0.06 % nasal spray Place 2 sprays into both nostrils 2 (two) times daily as needed. 05/13/23 06/30/23  Karis Clunes, MD    Family History Family History  Problem Relation Age of Onset   Osteoporosis Mother    Hypothyroidism Mother    Gout Father    Hyperlipidemia Father    Cancer Father        Pancreatic Tumor   Breast cancer Maternal Aunt    Colon cancer Paternal Uncle    Colon cancer Maternal Grandfather    Colon cancer Cousin    Hyperlipidemia Other    Colon polyps Neg Hx    Esophageal cancer Neg Hx    Stomach cancer Neg Hx    Rectal cancer Neg Hx      Social History Social History   Tobacco Use   Smoking status: Never    Passive exposure: Past   Smokeless tobacco: Never  Vaping Use   Vaping status: Never Used  Substance Use Topics   Alcohol use: Not Currently    Comment: occasional red wine couple times a year   Drug use: Never     Allergies   Patient has no known allergies.   Review of Systems Review of Systems  Constitutional:  Negative for chills and fever.  HENT:  Negative for ear pain and sore throat.   Eyes:  Negative for pain and visual disturbance.  Respiratory:  Negative for cough and shortness of breath.   Cardiovascular:  Negative for chest pain and palpitations.  Gastrointestinal:  Negative for abdominal pain and vomiting.  Genitourinary:  Positive for dysuria, frequency and hematuria.  Musculoskeletal:  Negative for arthralgias and back pain.  Skin:  Negative for color change and rash.  Neurological:  Negative for seizures and syncope.  All other systems reviewed and are negative.    Physical Exam Triage Vital Signs ED Triage Vitals  Encounter Vitals Group     BP 08/23/23 1029 (!) 107/59     Girls Systolic BP Percentile --      Girls Diastolic BP Percentile --      Boys Systolic BP Percentile --      Boys Diastolic BP Percentile --      Pulse Rate 08/23/23 1029 76     Resp 08/23/23 1029 18     Temp 08/23/23 1029 (!) 97.4 F (36.3 C)     Temp Source 08/23/23 1029 Oral     SpO2 08/23/23 1029 95 %     Weight --      Height --      Head Circumference --      Peak Flow --      Pain Score 08/23/23 1032 0     Pain Loc --      Pain Education --      Exclude from Growth Chart --    No data found.  Updated Vital Signs BP (!) 107/59 (BP Location: Left Arm)   Pulse 76   Temp (!) 97.4 F (36.3 C) (Oral)   Resp 18   SpO2 95%   Visual Acuity Right Eye Distance:   Left Eye Distance:   Bilateral Distance:    Right Eye Near:   Left Eye Near:    Bilateral Near:     Physical  Exam Vitals and nursing note reviewed.  Constitutional:      General: She is not in acute distress.    Appearance: Normal appearance. She is well-developed.  HENT:     Head:  Normocephalic and atraumatic.  Eyes:     Conjunctiva/sclera: Conjunctivae normal.  Cardiovascular:     Rate and Rhythm: Normal rate and regular rhythm.     Heart sounds: No murmur heard. Pulmonary:     Effort: Pulmonary effort is normal. No respiratory distress.     Breath sounds: Normal breath sounds.  Abdominal:     Palpations: Abdomen is soft.     Tenderness: There is no abdominal tenderness. There is no right CVA tenderness or left CVA tenderness.  Musculoskeletal:        General: No swelling.     Cervical back: Neck supple.  Skin:    General: Skin is warm and dry.     Capillary Refill: Capillary refill takes less than 2 seconds.  Neurological:     Mental Status: She is alert.  Psychiatric:        Mood and Affect: Mood normal.      UC Treatments / Results  Labs (all labs ordered are listed, but only abnormal results are displayed) Labs Reviewed  POCT URINALYSIS DIP (MANUAL ENTRY)    EKG   Radiology No results found.  Procedures Procedures (including critical care time)  Medications Ordered in UC Medications - No data to display  Initial Impression / Assessment and Plan / UC Course  I have reviewed the triage vital signs and the nursing notes.  Pertinent labs & imaging results that were available during my care of the patient were reviewed by me and considered in my medical decision making (see chart for details).     Acute cystitis with hematuria   Symptoms and physical exam findings are consistent with an urinary tract infection.  Urinalysis done today is also consistent with this.  We will send the urine for culture to ensure that the antibiotic will be adequate.  If any changes are needed in the antibiotics we will contact you.  We will treat with the following: Bactrim  DS  800/160 mg twice daily for 5 days. This is an antibiotic.  Hydrocortisone cream twice daily to the face and neck for 7 to 10 days for itching/rash.  May use daily as needed for itching/rash after. Consider urology referral if urinary tract infections are recurrent Return to urgent care or PCP if symptoms worsen or fail to resolve.    Final Clinical Impressions(s) / UC Diagnoses   Final diagnoses:  Acute cystitis with hematuria     Discharge Instructions      Symptoms and physical exam findings are consistent with an urinary tract infection.  Urinalysis done today is also consistent with this.  We will send the urine for culture to ensure that the antibiotic will be adequate.  If any changes are needed in the antibiotics we will contact you.  We will treat with the following: Bactrim  DS 800/160 mg twice daily for 5 days. This is an antibiotic.  Hydrocortisone cream twice daily to the face and neck for 7 to 10 days for itching/rash.  May use daily as needed for itching/rash after. Consider urology referral if urinary tract infections are recurrent Return to urgent care or PCP if symptoms worsen or fail to resolve.      ED Prescriptions     Medication Sig Dispense Auth. Provider   sulfamethoxazole -trimethoprim  (BACTRIM  DS) 800-160 MG tablet Take 1 tablet by mouth 2 (two) times daily for 5 days. 10 tablet Teresa Almarie LABOR, [place]      PDMP not reviewed this encounter.   Teresa Almarie LABOR, PA-C  08/23/23 1059  

## 2023-08-23 NOTE — ED Triage Notes (Signed)
 Patient presents to the office for Dysuria x 1 week. Patient has been taking AZO. Patient denies any fever or other symptoms.

## 2023-08-25 ENCOUNTER — Telehealth: Payer: Self-pay | Admitting: *Deleted

## 2023-08-25 DIAGNOSIS — E039 Hypothyroidism, unspecified: Secondary | ICD-10-CM

## 2023-08-25 NOTE — Telephone Encounter (Signed)
 Copied from CRM [id number]. Topic: Clinical - Request for Lab/Test Order >> Aug 25, 2023 12:37 PM Farrel B wrote: Patient called from [phone], stating that she would like the pcp to order labs for her on the day of her visit for 09/03/23 please call patient to advise   Tried calling patient, LMOM to return call.

## 2023-08-26 NOTE — Telephone Encounter (Signed)
 Message routed to PCP [name], MD for appointment 09/03/2023.

## 2023-08-28 ENCOUNTER — Other Ambulatory Visit: Payer: Self-pay | Admitting: Sports Medicine

## 2023-08-28 DIAGNOSIS — Z1231 Encounter for screening mammogram for malignant neoplasm of breast: Secondary | ICD-10-CM

## 2023-09-02 NOTE — Telephone Encounter (Signed)
 Noted

## 2023-09-02 NOTE — Telephone Encounter (Addendum)
 [name], MD to [facility] (Selected Message)     09/02/23  8:40 AM Ordered TSH  Remaining labs from June looks good.

## 2023-09-02 NOTE — Addendum Note (Signed)
 Addended by: Zacchaeus Halm on: 09/02/2023 08:40 AM   Modules accepted: Orders

## 2023-09-03 ENCOUNTER — Ambulatory Visit (INDEPENDENT_AMBULATORY_CARE_PROVIDER_SITE_OTHER): Admitting: Sports Medicine

## 2023-09-03 ENCOUNTER — Encounter: Payer: Self-pay | Admitting: Sports Medicine

## 2023-09-03 VITALS — BP 100/64 | HR 83 | Temp 97.8°F | Ht <= 58 in | Wt 106.2 lb

## 2023-09-03 DIAGNOSIS — R197 Diarrhea, unspecified: Secondary | ICD-10-CM

## 2023-09-03 DIAGNOSIS — K219 Gastro-esophageal reflux disease without esophagitis: Secondary | ICD-10-CM

## 2023-09-03 DIAGNOSIS — E039 Hypothyroidism, unspecified: Secondary | ICD-10-CM

## 2023-09-03 LAB — TSH: TSH: 0.55 m[IU]/L (ref 0.40–4.50)

## 2023-09-03 NOTE — Progress Notes (Signed)
 Careteam: Patient Care Team: [name], MD as PCP - General (Internal Medicine)  PLACE OF SERVICE:  [facility]  Advanced Directive information    No Known Allergies  Chief Complaint  Patient presents with   Acute Visit    Diarrhea // labs   dr stated no more colonoscopy due to anesthesia // oral surgen said it shouldn;t be a problem .SABRA pt wants to know if she has a problem with ansethia and is she able to get a colonoscopy.  Diarrhea //  pt stated she hasn't had any problems but would like a proper diagnosis   intestinal cancer  omeprazole  if she should be taking it for a long time due to medication can cause problems      Discussed the use of AI scribe software for clinical note transcription with the patient, who gave verbal consent to proceed.  History of Present Illness   Tammy [name] is a 73 year old female who presents with concerns about diarrhea.  She experienced diarrhea after consuming a high-fiber diet, which she enjoys regularly. The diarrhea resolved after she adjusted her diet by boiling white rice. The diarrhea lasted for a few days and has not recurred since she made dietary changes. She currently has regular bowel movements, sometimes twice a day, with no blood in her stool.  She had a colonoscopy two years ago, which showed a normal colon with no polyps.   She is taking omeprazole  daily for heartburn and acid reflux. She experiences occasional acid reflux but has no blood in her stool, chest pain, or breathing difficulties.   She mentions a past bladder infection that has resolved and reports experiencing dizziness, which she attributes to her medication, depakote .She follows with psychiatrist and states she is in full remission from her psychiatric condition.       Review of Systems:  Review of Systems  Constitutional:  Negative for chills and fever.  HENT:  Negative for congestion and sore throat.   Respiratory:  Negative  for cough, sputum production and shortness of breath.   Cardiovascular:  Negative for chest pain, palpitations and leg swelling.  Gastrointestinal:  Negative for abdominal pain, heartburn and nausea.  Genitourinary:  Negative for dysuria, frequency and hematuria.  Musculoskeletal:  Negative for falls and myalgias.  Neurological:  Negative for dizziness and focal weakness.     Negative unless indicated in HPI.   Past Medical History:  Diagnosis Date   Allergic rhinitis    Allergy    seasonal   Anxiety    Cataract Removed   Depression    History of colonic polyps    Hyperlipemia    Hypothyroidism    Osteoporosis    Past Surgical History:  Procedure Laterality Date   APPENDECTOMY     BREAST BIOPSY Right 1989   benign   CATARACT EXTRACTION BILATERAL W/ ANTERIOR VITRECTOMY Bilateral 2019   CESAREAN SECTION  1988   COLONOSCOPY  1998,2002,2007,2012   EYE SURGERY  cataract removal   LOBECTOMY  1989   POLYPECTOMY  1998 only   hyperplastic   Social History:   reports that she has never smoked. She has been exposed to tobacco smoke. She has never used smokeless tobacco. She reports that she does not currently use alcohol. She reports that she does not use drugs.  Family History  Problem Relation Age of Onset   Osteoporosis Mother    Hypothyroidism Mother    Anxiety disorder Mother    Hypertension  Mother    Varicose Veins Mother    Gout Father    Hyperlipidemia Father    Cancer Father        Pancreatic Tumor   Hearing loss Father    Breast cancer Maternal Aunt    Colon cancer Paternal Uncle    Colon cancer Maternal Grandfather    Colon cancer Cousin    Hyperlipidemia Other    Colon polyps Neg Hx    Esophageal cancer Neg Hx    Stomach cancer Neg Hx    Rectal cancer Neg Hx     Medications: Patient's Medications  New Prescriptions   No medications on file  Previous Medications   BUSPIRONE (BUSPAR) 10 MG TABLET    Take 10 mg by mouth in the morning, at noon, in the  evening, and at bedtime. 2 tablets in the morning, and 2 tablets at night.   CALCIUM CITRATE-VITAMIN D  (CITRACAL/VITAMIN D  PO)    Take 1 tablet by mouth daily. 650mg  Calcium 25mcg Vitamin D3   CALCIUM-VITAMIN D  PO    Take 1 tablet by mouth daily. 600mg  Calcium 20mcg Vitamin D3   DIVALPROEX (DEPAKOTE ER) 250 MG 24 HR TABLET    Take 250 mg by mouth. Three tabs daily   IPRATROPIUM (ATROVENT ) 0.06 % NASAL SPRAY    Place 2 sprays into both nostrils 2 (two) times daily as needed.   LEVOTHYROXINE  (SYNTHROID ) 50 MCG TABLET    Take 1 tablet (50 mcg total) by mouth daily.   LEVOTHYROXINE  (SYNTHROID ) 75 MCG TABLET    Take 75 mcg by mouth daily.   MULTIPLE VITAMINS-MINERALS (AIRBORNE GUMMIES) CHEW    Chew 3 Pieces by mouth daily.   MULTIPLE VITAMINS-MINERALS (WOMENS 50+ MULTI VITAMIN/MIN PO)    Take by mouth daily.   OMEPRAZOLE  (PRILOSEC) 40 MG CAPSULE    Take 1 capsule (40 mg total) by mouth daily.   ROSUVASTATIN (CRESTOR) 10 MG TABLET    Take 10 mg by mouth daily.   SERTRALINE (ZOLOFT) 100 MG TABLET    Take 150 mg by mouth daily.  Modified Medications   No medications on file  Discontinued Medications   No medications on file    Physical Exam: Vitals:   09/03/23 1442  BP: 100/64  Pulse: 83  Temp: 97.8 F (36.6 C)  SpO2: 90%  Weight: 106 lb 3.2 oz (48.2 kg)  Height: 4' 10 (1.473 m)   Body mass index is 22.2 kg/m. BP Readings from Last 3 Encounters:  09/03/23 100/64  08/23/23 (!) 107/59  06/30/23 100/70   Wt Readings from Last 3 Encounters:  09/03/23 106 lb 3.2 oz (48.2 kg)  06/30/23 107 lb 9.6 oz (48.8 kg)  06/12/23 108 lb (49 kg)    Physical Exam Constitutional:      Appearance: Normal appearance.  HENT:     Head: Normocephalic and atraumatic.  Cardiovascular:     Rate and Rhythm: Normal rate and regular rhythm.  Pulmonary:     Effort: Pulmonary effort is normal. No respiratory distress.     Breath sounds: Normal breath sounds. No wheezing.  Abdominal:     General: Bowel  sounds are normal. There is no distension.     Tenderness: There is no abdominal tenderness. There is no guarding or rebound.     Comments:    Musculoskeletal:        General: No swelling.  Neurological:     Mental Status: She is alert. Mental status is at baseline.  Motor: No weakness.     Labs reviewed: Basic Metabolic Panel: Recent Labs    10/19/22 1625 01/13/23 1501 06/27/23 0907  NA 140 139 142  K 3.5 3.6 3.8  CL 104 105 104  CO2 27 27 31   GLUCOSE 113* 106* 91  BUN 22 19 23   CREATININE 0.61 0.66 0.70  CALCIUM 8.8* 8.7* 9.1  MG 1.8  --   --   TSH  --   --  0.11*   Liver Function Tests: Recent Labs    10/19/22 1625 01/13/23 1501 06/27/23 0907  AST 25 18 20   ALT 14 9 11   ALKPHOS 61 70  --   BILITOT 0.7 0.6 0.6  PROT 6.7 6.5 6.7  ALBUMIN 3.6 3.4*  --    Recent Labs    10/19/22 1625  LIPASE 32   No results for input(s): AMMONIA in the last 8760 hours. CBC: Recent Labs    10/19/22 1625 01/13/23 1501 06/27/23 0907  WBC 5.1 4.7 4.0  NEUTROABS 4.1 3.3 1,592  HGB 11.4* 11.1* 12.0  HCT 34.8* 34.1* 37.7  MCV 91.6 90.0 93.3  PLT 152 146* 162   Lipid Panel: Recent Labs    06/27/23 0907  CHOL 171  HDL 65  LDLCALC 90  TRIG 69  CHOLHDL 2.6   TSH: Recent Labs    06/27/23 0907  TSH 0.11*   A1C: Lab Results  Component Value Date   HGBA1C 5.7 (H) 06/27/2023    Assessment and Plan Assessment & Plan   1. Diarrhea, unspecified type (Primary)  Resolved Normal colonoscopy 2022 Instructed to take probiotics  2. Acquired hypothyroidism   Lab Results  Component Value Date   TSH 0.11 (L) 06/27/2023   Instructed patient to go to lab to check TSH levels  3. Gastroesophageal reflux disease, unspecified whether esophagitis present  Denies acid reflux Cont with omeprazole 

## 2023-09-05 ENCOUNTER — Encounter (HOSPITAL_COMMUNITY): Payer: Self-pay

## 2023-09-05 ENCOUNTER — Ambulatory Visit (HOSPITAL_COMMUNITY)
Admission: EM | Admit: 2023-09-05 | Discharge: 2023-09-05 | Disposition: A | Discharge status: Home / Self Care | Attending: Family Medicine | Admitting: Family Medicine

## 2023-09-05 DIAGNOSIS — R399 Unspecified symptoms and signs involving the genitourinary system: Secondary | ICD-10-CM | POA: Diagnosis not present

## 2023-09-05 LAB — POCT URINALYSIS DIP (MANUAL ENTRY)
Bilirubin, UA: NEGATIVE
Glucose, UA: NEGATIVE mg/dL
Ketones, POC UA: NEGATIVE mg/dL
Nitrite, UA: NEGATIVE
Protein Ur, POC: NEGATIVE mg/dL
Spec Grav, UA: 1.01 (ref 1.010–1.025)
Urobilinogen, UA: 0.2 U/dL
pH, UA: 7 (ref 5.0–8.0)

## 2023-09-05 MED ORDER — SULFAMETHOXAZOLE-TRIMETHOPRIM 800-160 MG PO TABS
1.0000 | ORAL_TABLET | Freq: Two times a day (BID) | ORAL | 0 refills | Status: AC
Start: 2023-09-05 — End: 2023-09-10

## 2023-09-05 NOTE — Discharge Instructions (Signed)
 Take antibiotic as prescribed. Will call with urine culture results and change treatment plan if indicated. Return if you develop new or worsening symptoms. Recommend follow-up with PCP or urology if UTIs become more frequent.

## 2023-09-05 NOTE — ED Provider Notes (Signed)
 [facility]    CSN: [id number] Arrival date & time: 09/05/23  1522      History   Chief Complaint Chief Complaint  Patient presents with   Urinary Frequency   Dysuria    HPI Melika Reder is a 73 y.o. female.   Patient presents with increased urinary frequency and dysuria that started last night.  She was treated for UTI on 08/23/2023 and reports resolution of symptoms.  Reports today symptoms feel exactly the same as at this month and treated for UTI.  She denies abdominal pain, fever, chills, flank pain, nausea, vomiting.  She reports she has been drinking more water since symptoms started.    Past Medical History:  Diagnosis Date   Allergic rhinitis    Allergy    seasonal   Anxiety    Cataract Removed   Depression    History of colonic polyps    Hyperlipemia    Hypothyroidism    Osteoporosis     Patient Active Problem List   Diagnosis Date Noted   Chronic rhinitis 05/13/2023   Deviated nasal septum 05/13/2023   Hypertrophy of nasal turbinates 05/13/2023   Postnasal drip 05/13/2023   Vertigo 10/22/2022   Gastroesophageal reflux disease 03/04/2022   Memory impairment 01/12/2021   Leukopenia 06/26/2020   Recurrent major depression in remission (HCC) 11/18/2019   Bipolar disorder (HCC) 11/08/2013   UTI 08/30/2009   Hypothyroidism 11/24/2006   Hyperlipidemia 11/24/2006   Anxiety state 11/24/2006   DEPRESSION 11/24/2006   Allergic rhinitis 11/24/2006   Osteoporosis 11/24/2006   History of colonic polyps 11/24/2006    Past Surgical History:  Procedure Laterality Date   APPENDECTOMY     BREAST BIOPSY Right 1989   benign   CATARACT EXTRACTION BILATERAL W/ ANTERIOR VITRECTOMY Bilateral 2019   CESAREAN SECTION  1988   COLONOSCOPY  1998,2002,2007,2012   EYE SURGERY  cataract removal   LOBECTOMY  1989   POLYPECTOMY  1998 only   hyperplastic    OB History   No obstetric history on file.      Home Medications    Prior to Admission  medications   Medication Sig Start Date End Date Taking? Authorizing Provider  sulfamethoxazole -trimethoprim  (BACTRIM  DS) 800-160 MG tablet Take 1 tablet by mouth 2 (two) times daily for 5 days. 09/05/23 09/10/23 Yes Ward, Harlene PEDLAR, PA-C  busPIRone (BUSPAR) 10 MG tablet Take 10 mg by mouth in the morning, at noon, in the evening, and at bedtime. 2 tablets in the morning, and 2 tablets at night. 10/01/22   [provider]  Calcium Citrate-Vitamin D  (CITRACAL/VITAMIN D  PO) Take 1 tablet by mouth daily. 650mg  Calcium 25mcg Vitamin D3    [provider]  CALCIUM-VITAMIN D  PO Take 1 tablet by mouth daily. 600mg  Calcium 20mcg Vitamin D3    [provider]  divalproex (DEPAKOTE ER) 250 MG 24 hr tablet Take 250 mg by mouth. Three tabs daily    [provider]  ipratropium (ATROVENT ) 0.06 % nasal spray Place 2 sprays into both nostrils 2 (two) times daily as needed. 05/13/23 06/30/23  Karis Clunes, MD  levothyroxine  (SYNTHROID ) 50 MCG tablet Take 1 tablet (50 mcg total) by mouth daily. 07/02/23   [name], MD  Multiple Vitamins-Minerals (AIRBORNE GUMMIES) CHEW Chew 3 Pieces by mouth daily.    [provider]  Multiple Vitamins-Minerals (WOMENS 50+ MULTI VITAMIN/MIN PO) Take by mouth daily.    [provider]  omeprazole  (PRILOSEC) 40 MG capsule Take 1  capsule (40 mg total) by mouth daily. 08/12/23   Veludandi, Prashanthi, MD  rosuvastatin (CRESTOR) 10 MG tablet Take 10 mg by mouth daily. 10/24/22   [provider]  sertraline (ZOLOFT) 100 MG tablet Take 150 mg by mouth daily.    [provider]    Family History Family History  Problem Relation Age of Onset   Osteoporosis Mother    Hypothyroidism Mother    Anxiety disorder Mother    Hypertension Mother    Varicose Veins Mother    Gout Father    Hyperlipidemia Father    Cancer Father        Pancreatic Tumor   Hearing loss Father    Breast cancer Maternal Aunt    Colon  cancer Paternal Uncle    Colon cancer Maternal Grandfather    Colon cancer Cousin    Hyperlipidemia Other    Colon polyps Neg Hx    Esophageal cancer Neg Hx    Stomach cancer Neg Hx    Rectal cancer Neg Hx     Social History Social History   Tobacco Use   Smoking status: Never    Passive exposure: Past   Smokeless tobacco: Never  Vaping Use   Vaping status: Never Used  Substance Use Topics   Alcohol use: Yes    Comment: occasional red wine couple times a year   Drug use: Never     Allergies   Patient has no known allergies.   Review of Systems Review of Systems  Constitutional:  Negative for chills and fever.  HENT:  Negative for ear pain and sore throat.   Eyes:  Negative for pain and visual disturbance.  Respiratory:  Negative for cough and shortness of breath.   Cardiovascular:  Negative for chest pain and palpitations.  Gastrointestinal:  Negative for abdominal pain and vomiting.  Genitourinary:  Positive for dysuria and frequency. Negative for hematuria.  Musculoskeletal:  Negative for arthralgias and back pain.  Skin:  Negative for color change and rash.  Neurological:  Negative for seizures and syncope.  All other systems reviewed and are negative.    Physical Exam Triage Vital Signs ED Triage Vitals [09/05/23 1607]  Encounter Vitals Group     BP 107/74     Girls Systolic BP Percentile      Girls Diastolic BP Percentile      Boys Systolic BP Percentile      Boys Diastolic BP Percentile      Pulse Rate 68     Resp 14     Temp 98.2 F (36.8 C)     Temp Source Oral     SpO2 96 %     Weight      Height      Head Circumference      Peak Flow      Pain Score 5     Pain Loc      Pain Education      Exclude from Growth Chart    No data found.  Updated Vital Signs BP 107/74 (BP Location: Left Arm)   Pulse 68   Temp 98.2 F (36.8 C) (Oral)   Resp 14   SpO2 96%   Visual Acuity Right Eye Distance:   Left Eye Distance:   Bilateral  Distance:    Right Eye Near:   Left Eye Near:    Bilateral Near:     Physical Exam Vitals and nursing note reviewed.  Constitutional:      General:  She is not in acute distress.    Appearance: She is well-developed.  HENT:     Head: Normocephalic and atraumatic.  Eyes:     Conjunctiva/sclera: Conjunctivae normal.  Cardiovascular:     Rate and Rhythm: Normal rate and regular rhythm.     Heart sounds: No murmur heard. Pulmonary:     Effort: Pulmonary effort is normal. No respiratory distress.     Breath sounds: Normal breath sounds.  Abdominal:     Palpations: Abdomen is soft.     Tenderness: There is no abdominal tenderness.  Musculoskeletal:        General: No swelling.     Cervical back: Neck supple.  Skin:    General: Skin is warm and dry.     Capillary Refill: Capillary refill takes less than 2 seconds.  Neurological:     Mental Status: She is alert.  Psychiatric:        Mood and Affect: Mood normal.      UC Treatments / Results  Labs (all labs ordered are listed, but only abnormal results are displayed) Labs Reviewed  POCT URINALYSIS DIP (MANUAL ENTRY) - Abnormal; Notable for the following components:      Result Value   Blood, UA trace-lysed (*)    Leukocytes, UA Small (1+) (*)    All other components within normal limits    EKG   Radiology No results found.  Procedures Procedures (including critical care time)  Medications Ordered in UC Medications - No data to display  Initial Impression / Assessment and Plan / UC Course  I have reviewed the triage vital signs and the nursing notes.  Pertinent labs & imaging results that were available during my care of the patient were reviewed by me and considered in my medical decision making (see chart for details).     Will treat for UTI based on symptoms.  Will send out urine culture and change treatment plan if indicated.  Supportive care discussed.  Return precautions discussed. Final Clinical  Impressions(s) / UC Diagnoses   Final diagnoses:  UTI symptoms     Discharge Instructions      Take antibiotic as prescribed. Will call with urine culture results and change treatment plan if indicated. Return if you develop new or worsening symptoms. Recommend follow-up with PCP or urology if UTIs become more frequent.   ED Prescriptions     Medication Sig Dispense Auth. Provider   sulfamethoxazole -trimethoprim  (BACTRIM  DS) 800-160 MG tablet Take 1 tablet by mouth 2 (two) times daily for 5 days. 10 tablet Ward, Ameena Vesey Z, PA-C      PDMP not reviewed this encounter.   Ward, Harlene PEDLAR, PA-C 09/05/23 [phone]

## 2023-09-05 NOTE — ED Triage Notes (Signed)
 Patient states she was seen and treated for a UTI on 08/23/23 and reports that she began having burning and urinary frequency last night.

## 2023-09-17 ENCOUNTER — Ambulatory Visit
Admission: RE | Admit: 2023-09-17 | Discharge: 2023-09-17 | Disposition: A | Discharge status: Home / Self Care | Source: Ambulatory Visit | Attending: Sports Medicine | Admitting: Sports Medicine

## 2023-09-17 DIAGNOSIS — Z1231 Encounter for screening mammogram for malignant neoplasm of breast: Secondary | ICD-10-CM

## 2023-10-06 ENCOUNTER — Ambulatory Visit: Payer: Self-pay

## 2023-10-06 ENCOUNTER — Telehealth: Payer: Self-pay

## 2023-10-06 NOTE — Telephone Encounter (Signed)
 FYI Only or Action Required?: FYI only for provider.  Patient was last seen in primary care on 09/03/2023 by [name], MD.  Called Nurse Triage reporting Thumb Pain.  Symptoms began a week ago.  Interventions attempted: Nothing.  Symptoms are: stable.  Triage Disposition: See PCP When Office is Open (Within 3 Days)  Patient/caregiver understands and will follow disposition?: Yes Reason for Disposition  [1] Swollen joint AND [2] no fever or redness  Answer Assessment - Initial Assessment Questions 1. ONSET: When did the pain start?      10 days ago  2. LOCATION and RADIATION: Where is the pain located?  (e.g., fingertip, around nail, joint, entire      Left thumb   3. SEVERITY: How bad is the pain? What does it keep you from doing?   (Scale 1-10; or mild, moderate, severe)     Painful to bed the finger  4. APPEARANCE: What does the finger look like? (e.g., redness, swelling, bruising, pallor)     Little swelling  5. CAUSE: What do you think is causing the pain?     Unsure, suspects arthritis  Protocols used: Finger Pain-A-AH  Copied from CRM [id number]. Topic: Clinical - Red Word Triage >> Oct 06, 2023  4:25 PM Zane F wrote: [facility] that prompted transfer to Nurse Triage:   Concern: left thumb hurts to bend   When did the symptoms start?: a week ago   What have you done to aid in the concern ? Have you taken anything to assist with the matter?: No   If so, what did you take?: Patient is using a hand brace to keep the thumb straight; this provides relief    Wanted to let you know I will be transferring you to further discuss your concern. Please be advised the nurse can assist with scheduling.

## 2023-10-06 NOTE — Telephone Encounter (Signed)
 Aragona, Ariel L, RN to [facility] (Selected Message)     10/06/23  4:37 PM FYI

## 2023-10-06 NOTE — Telephone Encounter (Signed)
 Copied from CRM #[id number]. Topic: Clinical - Prescription Issue >> Oct 06, 2023  4:21 PM Brittney F wrote: Reason for CRM:  Prescription In question: levothyroxine  (SYNTHROID ) 50 MCG tablet  Patient's new refill of the levothyroxine  was just picked up and is listed as 50 mcg and the patient was under the impression that the medication should be 75 mcg. The patient would like to know why the provider changed the prescription.  Preferred Pharmacy:  [facility] [address] [place] - [address] [place], [place] [address] Phone: [phone]  Fax: [phone]    Please call patient with an update.   Callback Number: [phone]

## 2023-10-06 NOTE — Telephone Encounter (Signed)
 Message routed to PCP [name], MD an medical assistant [place].G/CMA as RICK. Patient scheduled for tomorrow 10/07/2023.

## 2023-10-07 ENCOUNTER — Ambulatory Visit (INDEPENDENT_AMBULATORY_CARE_PROVIDER_SITE_OTHER): Admitting: Sports Medicine

## 2023-10-07 ENCOUNTER — Ambulatory Visit
Admission: RE | Admit: 2023-10-07 | Discharge: 2023-10-07 | Disposition: A | Discharge status: Home / Self Care | Source: Ambulatory Visit | Attending: Sports Medicine

## 2023-10-07 ENCOUNTER — Encounter: Payer: Self-pay | Admitting: Sports Medicine

## 2023-10-07 VITALS — BP 110/58 | HR 83 | Temp 97.7°F | Ht <= 58 in

## 2023-10-07 DIAGNOSIS — E039 Hypothyroidism, unspecified: Secondary | ICD-10-CM | POA: Diagnosis not present

## 2023-10-07 DIAGNOSIS — K219 Gastro-esophageal reflux disease without esophagitis: Secondary | ICD-10-CM

## 2023-10-07 DIAGNOSIS — M79645 Pain in left finger(s): Secondary | ICD-10-CM | POA: Diagnosis not present

## 2023-10-07 MED ORDER — LEVOTHYROXINE SODIUM 50 MCG PO TABS: 50.0000 ug | ORAL_TABLET | Freq: Every day | ORAL | 3 refills | Status: AC

## 2023-10-07 NOTE — Patient Instructions (Signed)
 Take tylenol 500mg  three times daily as needed

## 2023-10-07 NOTE — Progress Notes (Signed)
 Careteam: Patient Care Team: [name], MD as PCP - General (Internal Medicine)  PLACE OF SERVICE:  [facility]  Advanced Directive information    No Known Allergies  Chief Complaint  Patient presents with   Acute Visit    Patient has been having pain in left thumb for 10 days and ir hurts to bend. Patient hasn't taking anything for it. Patient currently has a cast on finger      Discussed the use of AI scribe software for clinical note transcription with the patient, who gave verbal consent to proceed.  History of Present Illness  Tammy [name] is a 73 year old female who presents with left thumb pain.  She has been experiencing pain in her left thumb for the past ten days, particularly when attempting to bend it. The pain is described as 'achy' and is rated as an eight out of ten in intensity. There is no history of trauma or injury to the thumb. She notes a sensation of movement inside the thumb when bending it. She has not taken any medication such as Tylenol for the pain. The pain does not prevent her from squeezing her hand tightly, although she avoids using her left hand to hold things due to fear of pain.  She recently recovered from a cold, with symptoms of cough and congestion having resolved. However, she continues to experience hoarseness and acid reflux. No current respiratory issues, abdominal pain, nausea, or urinary symptoms are reported.  Her current medications include omeprazole  for heartburn, which she takes daily with variable effectiveness. She is also on Depakote, Zoloft, and Buspar for bipolar disorder, depression, and anxiety. Her thyroid  medication, Synthroid , was adjusted in June from 75 micrograms to 50 micrograms . She continues to take the 50 microgram dose.  No current cough, congestion, breathing difficulties, abdominal pain, nausea, urinary symptoms, dizziness, or recent falls. She feels generally alright in terms of mood and  maintains a routine of walking for exercise.    Review of Systems:  Review of Systems  Constitutional:  Negative for chills and fever.  HENT:  Negative for congestion and sore throat.   Respiratory:  Negative for cough, sputum production and shortness of breath.   Cardiovascular:  Negative for chest pain, palpitations and leg swelling.  Gastrointestinal:  Negative for abdominal pain, heartburn and nausea.  Genitourinary:  Negative for dysuria, frequency and hematuria.  Musculoskeletal:  Positive for joint pain. Negative for falls and myalgias.  Neurological:  Negative for dizziness, sensory change and focal weakness.   Negative unless indicated in HPI.   Past Medical History:  Diagnosis Date   Allergic rhinitis    Allergy    seasonal   Anxiety    Cataract Removed   Depression    History of colonic polyps    Hyperlipemia    Hypothyroidism    Osteoporosis    Past Surgical History:  Procedure Laterality Date   APPENDECTOMY     BREAST BIOPSY Right 1989   benign   CATARACT EXTRACTION BILATERAL W/ ANTERIOR VITRECTOMY Bilateral 2019   CESAREAN SECTION  1988   COLONOSCOPY  1998,2002,2007,2012   EYE SURGERY  cataract removal   LOBECTOMY  1989   POLYPECTOMY  1998 only   hyperplastic   Social History:   reports that she has never smoked. She has been exposed to tobacco smoke. She has never used smokeless tobacco. She reports current alcohol use. She reports that she does not use drugs.  Family History  Problem Relation Age of Onset   Osteoporosis Mother    Hypothyroidism Mother    Anxiety disorder Mother    Hypertension Mother    Varicose Veins Mother    Gout Father    Hyperlipidemia Father    Cancer Father        Pancreatic Tumor   Hearing loss Father    Breast cancer Maternal Aunt    Colon cancer Paternal Uncle    Colon cancer Maternal Grandfather    Colon cancer Cousin    Hyperlipidemia Other    Colon polyps Neg Hx    Esophageal cancer Neg Hx    Stomach cancer  Neg Hx    Rectal cancer Neg Hx     Medications: Patient's Medications  New Prescriptions   No medications on file  Previous Medications   BUSPIRONE (BUSPAR) 10 MG TABLET    Take 10 mg by mouth 2 (two) times daily at 10 AM and 5 PM. 2 tablets in the morning, and 2 tablets at night.   CALCIUM CITRATE-VITAMIN D  (CITRACAL/VITAMIN D  PO)    Take 1 tablet by mouth daily. 650mg  Calcium 25mcg Vitamin D3   CALCIUM-VITAMIN D  PO    Take 1 tablet by mouth daily. 600mg  Calcium 20mcg Vitamin D3   DIVALPROEX (DEPAKOTE ER) 250 MG 24 HR TABLET    Take 250 mg by mouth. Three tabs daily   IPRATROPIUM (ATROVENT ) 0.06 % NASAL SPRAY    Place 2 sprays into both nostrils 2 (two) times daily as needed.   LEVOTHYROXINE  (SYNTHROID ) 50 MCG TABLET    Take 1 tablet (50 mcg total) by mouth daily.   MULTIPLE VITAMINS-MINERALS (AIRBORNE GUMMIES) CHEW    Chew 3 Pieces by mouth daily.   MULTIPLE VITAMINS-MINERALS (WOMENS 50+ MULTI VITAMIN/MIN PO)    Take by mouth daily.   OMEPRAZOLE  (PRILOSEC) 40 MG CAPSULE    Take 1 capsule (40 mg total) by mouth daily.   ROSUVASTATIN (CRESTOR) 10 MG TABLET    Take 10 mg by mouth daily.   SERTRALINE (ZOLOFT) 100 MG TABLET    Take 150 mg by mouth daily.  Modified Medications   No medications on file  Discontinued Medications   No medications on file    Physical Exam: Vitals:   10/07/23 1438  BP: (!) 110/58  Pulse: 83  Temp: 97.7 F (36.5 C)  SpO2: 98%  Height: 4' 10 (1.473 m)   Body mass index is 22.2 kg/m. BP Readings from Last 3 Encounters:  10/07/23 (!) 110/58  09/05/23 107/74  09/03/23 100/64   Wt Readings from Last 3 Encounters:  09/03/23 106 lb 3.2 oz (48.2 kg)  06/30/23 107 lb 9.6 oz (48.8 kg)  06/12/23 108 lb (49 kg)    Physical Exam Constitutional:      Appearance: Normal appearance.  HENT:     Head: Normocephalic and atraumatic.  Cardiovascular:     Rate and Rhythm: Normal rate and regular rhythm.  Pulmonary:     Effort: Pulmonary effort is normal. No  respiratory distress.     Breath sounds: Normal breath sounds. No wheezing.  Abdominal:     General: Bowel sounds are normal. There is no distension.     Tenderness: There is no abdominal tenderness. There is no guarding or rebound.     Comments:    Musculoskeletal:        General: No swelling.     Comments: Mild tenderness at the base of left thumb    Skin:  General: Skin is dry.  Neurological:     Mental Status: She is alert. Mental status is at baseline.     Sensory: No sensory deficit.     Motor: No weakness.     Labs reviewed: Basic Metabolic Panel: Recent Labs    10/19/22 1625 01/13/23 1501 06/27/23 0907 09/03/23 1510  NA 140 139 142  --   K 3.5 3.6 3.8  --   CL 104 105 104  --   CO2 27 27 31   --   GLUCOSE 113* 106* 91  --   BUN 22 19 23   --   CREATININE 0.61 0.66 0.70  --   CALCIUM 8.8* 8.7* 9.1  --   MG 1.8  --   --   --   TSH  --   --  0.11* 0.55   Liver Function Tests: Recent Labs    10/19/22 1625 01/13/23 1501 06/27/23 0907  AST 25 18 20   ALT 14 9 11   ALKPHOS 61 70  --   BILITOT 0.7 0.6 0.6  PROT 6.7 6.5 6.7  ALBUMIN 3.6 3.4*  --    Recent Labs    10/19/22 1625  LIPASE 32   No results for input(s): AMMONIA in the last 8760 hours. CBC: Recent Labs    10/19/22 1625 01/13/23 1501 06/27/23 0907  WBC 5.1 4.7 4.0  NEUTROABS 4.1 3.3 1,592  HGB 11.4* 11.1* 12.0  HCT 34.8* 34.1* 37.7  MCV 91.6 90.0 93.3  PLT 152 146* 162   Lipid Panel: Recent Labs    06/27/23 0907  CHOL 171  HDL 65  LDLCALC 90  TRIG 69  CHOLHDL 2.6   TSH: Recent Labs    06/27/23 0907 09/03/23 1510  TSH 0.11* 0.55   A1C: Lab Results  Component Value Date   HGBA1C 5.7 (H) 06/27/2023    Assessment and Plan Assessment & Plan   1. Pain of left thumb (Primary) Will get x ray thumb  Take tylenol prn for pain - DG Finger Thumb Left; Future  2. Acquired hypothyroidism Lab Results  Component Value Date   TSH 0.55 09/03/2023   Instructed patient to  go to lab to have TSH rechecked - levothyroxine  (SYNTHROID ) 50 MCG tablet; Take 1 tablet (50 mcg total) by mouth daily.  Dispense: 90 tablet; Refill: 3  3. Gastroesophageal reflux disease, unspecified whether esophagitis present Cont with omeprazole 

## 2023-10-07 NOTE — Telephone Encounter (Signed)
 Patient is requesting refill and will be seen in office today 10/07/2023.

## 2023-10-07 NOTE — Telephone Encounter (Signed)
 Noted

## 2023-10-13 ENCOUNTER — Ambulatory Visit: Payer: Self-pay | Admitting: Sports Medicine

## 2023-10-13 ENCOUNTER — Telehealth: Payer: Self-pay

## 2023-10-13 NOTE — Telephone Encounter (Signed)
 IMPRESSION: 1. Advanced osteoarthritis at the first carpometacarpal joint.

## 2023-10-13 NOTE — Telephone Encounter (Unsigned)
 Copied from CRM #[id number]. Topic: Clinical - Medical Advice >> Oct 13, 2023  4:28 PM Susanna ORN wrote: Reason for CRM: Patient is requesting a response via MyChart. States she wants to know if she can have arthritis removed from her left thumb permanently? Please respond to her via MyChart per her request.

## 2023-10-14 NOTE — Telephone Encounter (Unsigned)
 Copied from CRM #[id number]. Topic: Clinical - Medical Advice >> Oct 13, 2023  4:28 PM Tammy [name] wrote: Reason for CRM: Patient is requesting a response via MyChart. States she wants to know if she can have arthritis removed from her left thumb permanently? Please respond to her via MyChart per her request. >> Oct 14, 2023 12:50 PM Tammy [name] wrote: Patient returning call on thumb issue. Called office but n/a. LVM for call back for PT. [phone]. Also can send message thru MyChart preferred

## 2023-10-16 NOTE — Telephone Encounter (Signed)
 Message not responded to

## 2023-10-16 NOTE — Telephone Encounter (Signed)
 Outgoing call placed to patient, no answer, left message encouraging patient to look at [facility] for providers response.   [name], MD to Me  (Selected Message)     10/16/23 10:08 AM She has x ray showed advanced osteoarthritis. I cannot remove diagnosis from her chart  She needs to go to medical records , thanks.

## 2023-10-20 ENCOUNTER — Other Ambulatory Visit: Payer: Self-pay | Admitting: Sports Medicine

## 2023-10-20 DIAGNOSIS — M79645 Pain in left finger(s): Secondary | ICD-10-CM

## 2023-10-20 NOTE — Progress Notes (Signed)
 Referral placed to ortho.

## 2023-10-22 ENCOUNTER — Other Ambulatory Visit: Payer: Self-pay | Admitting: Sports Medicine

## 2023-10-22 MED ORDER — ROSUVASTATIN CALCIUM 10 MG PO TABS: 10.0000 mg | ORAL_TABLET | Freq: Every day | ORAL | 1 refills | Status: AC

## 2023-10-22 NOTE — Telephone Encounter (Signed)
 Copied from CRM #[id number]. Topic: Clinical - Medication Refill >> Oct 22, 2023  2:10 PM Fredrica W wrote: Medication: rosuvastatin (CRESTOR) 10 MG tablet  Has the patient contacted their pharmacy? Yes (Agent: If no, request that the patient contact the pharmacy for the refill. If patient does not wish to contact the pharmacy document the reason why and proceed with request.) (Agent: If yes, when and what did the pharmacy advise?) Need renewal   This is the patient's preferred pharmacy:  [id number] [place], [place] - [address] [place] [address] Phone: [phone] Fax: [phone]  Is this the correct pharmacy for this prescription? Yes If no, delete pharmacy and type the correct one.   Has the prescription been filled recently? No  Is the patient out of the medication? No  Has the patient been seen for an appointment in the last year OR does the patient have an upcoming appointment? Yes  Can we respond through MyChart? No  Agent: Please be advised that Rx refills may take up to 3 business days. We ask that you follow-up with your pharmacy.

## 2023-10-30 DIAGNOSIS — F411 Generalized anxiety disorder: Secondary | ICD-10-CM | POA: Diagnosis not present

## 2023-10-30 DIAGNOSIS — F410 Panic disorder [episodic paroxysmal anxiety] without agoraphobia: Secondary | ICD-10-CM | POA: Diagnosis not present

## 2023-10-30 DIAGNOSIS — F3131 Bipolar disorder, current episode depressed, mild: Secondary | ICD-10-CM | POA: Diagnosis not present

## 2023-11-04 ENCOUNTER — Ambulatory Visit: Admitting: Physician Assistant

## 2023-11-04 ENCOUNTER — Encounter: Payer: Self-pay | Admitting: Physician Assistant

## 2023-11-04 DIAGNOSIS — M79645 Pain in left finger(s): Secondary | ICD-10-CM | POA: Diagnosis not present

## 2023-11-04 NOTE — Progress Notes (Signed)
 Office Visit Note   Patient: Tammy [name]           Date of Birth: 05-11-1950           MRN: [id number] Visit Date: 11/04/2023              Requested by: [name], MD [address] [place],  [place] [id number] PCP: [name], MD   Assessment & Plan: Visit Diagnoses:  1. Pain of left thumb     Plan: Pleasant 73 year old woman with history of left thumb pain no particular injury.  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  On exam today I cannot appreciate any triggering and most of her pain is at the IP joint.  She does have a small supportive brace that helps her.  She takes acetaminophen I told her to try taking Advil as needed instead.  Also give them some information on topical Voltaren gel.  Also discussed seeing a hand therapist a couple times have made a referral may follow-up with me as needed  Follow-Up Instructions: Return if symptoms worsen or fail to improve.   Orders:  Orders Placed This Encounter  Procedures   Ambulatory referral to Occupational Therapy   No orders of the defined types were placed in this encounter.     Procedures: No procedures performed   Clinical Data: No additional findings.   Subjective: No chief complaint on file.   HPI patient is a pleasant 73 year old woman who comes in today with a chief complaint of left thumb pain.  Denies any injury.  Hurts mostly at the distal IP joint.  She has been using a small splint that is helpful  Review of Systems  All other systems reviewed and are negative.    Objective: Vital Signs: There were no vitals taken for this visit.  Physical Exam Constitutional:      Appearance: Normal appearance.  Pulmonary:     Effort: Pulmonary effort is normal.  Skin:    General: Skin is warm and dry.  Neurological:     General: No focal deficit present.     Mental Status: She is alert and oriented to person, place, and time.  Psychiatric:         Mood and Affect: Mood normal.        Behavior: Behavior normal.     Ortho Exam Examination of her left thumb no swelling no erythema she has good range of motion good strength with abduction adduction opposition to all her other fingers brisk capillary refill she does have some tenderness at the IP joint as well at the [facility] joint though the IP joint is more symptomatic hand fingers are warm pulses intact Specialty Comments:  No specialty comments available.  Imaging: No results found.   PMFS History: Patient Active Problem List   Diagnosis Date Noted   Pain of left thumb 11/04/2023   Chronic rhinitis 05/13/2023   Deviated nasal septum 05/13/2023   Hypertrophy of nasal turbinates 05/13/2023   Postnasal drip 05/13/2023   Vertigo 10/22/2022   Gastroesophageal reflux disease 03/04/2022   Memory impairment 01/12/2021   Leukopenia 06/26/2020   Recurrent major depression in remission 11/18/2019   Bipolar disorder (HCC) 11/08/2013   UTI 08/30/2009   Hypothyroidism 11/24/2006   Hyperlipidemia 11/24/2006   Anxiety state 11/24/2006   DEPRESSION 11/24/2006   Allergic rhinitis 11/24/2006   Osteoporosis 11/24/2006   History of colonic polyps 11/24/2006   Past Medical History:  Diagnosis Date   Allergic rhinitis    Allergy    seasonal   Anxiety    Cataract Removed   Depression    History of colonic polyps    Hyperlipemia    Hypothyroidism    Osteoporosis     Family History  Problem Relation Age of Onset   Osteoporosis Mother    Hypothyroidism Mother    Anxiety disorder Mother    Hypertension Mother    Varicose Veins Mother    Gout Father    Hyperlipidemia Father    Cancer Father        Pancreatic Tumor   Hearing loss Father    Breast cancer Maternal Aunt    Colon cancer Paternal Uncle    Colon cancer Maternal Grandfather    Colon cancer Cousin    Hyperlipidemia Other    Colon polyps Neg Hx    Esophageal cancer Neg Hx    Stomach cancer Neg Hx    Rectal  cancer Neg Hx     Past Surgical History:  Procedure Laterality Date   APPENDECTOMY     BREAST BIOPSY Right 1989   benign   CATARACT EXTRACTION BILATERAL W/ ANTERIOR VITRECTOMY Bilateral 2019   CESAREAN SECTION  1988   COLONOSCOPY  1998,2002,2007,2012   EYE SURGERY  cataract removal   LOBECTOMY  1989   POLYPECTOMY  1998 only   hyperplastic   Social History   Occupational History   Occupation: unemployed  Tobacco Use   Smoking status: Never    Passive exposure: Past   Smokeless tobacco: Never  Vaping Use   Vaping status: Never Used  Substance and Sexual Activity   Alcohol use: Yes    Comment: occasional red wine couple times a year   Drug use: Never   Sexual activity: Yes

## 2023-11-17 ENCOUNTER — Encounter: Payer: Self-pay | Admitting: Radiology

## 2023-12-03 ENCOUNTER — Ambulatory Visit: Admitting: Sports Medicine

## 2024-01-27 ENCOUNTER — Ambulatory Visit: Payer: Self-pay | Admitting: Family

## 2024-02-03 ENCOUNTER — Encounter: Admitting: Family

## 2024-02-06 ENCOUNTER — Other Ambulatory Visit: Payer: Self-pay | Admitting: Sports Medicine

## 2024-02-12 ENCOUNTER — Ambulatory Visit: Admitting: Family

## 2024-02-12 ENCOUNTER — Encounter: Payer: Self-pay | Admitting: Family

## 2024-02-12 VITALS — BP 112/62 | HR 70 | Temp 97.0°F | Ht <= 58 in | Wt 109.6 lb

## 2024-02-12 DIAGNOSIS — M810 Age-related osteoporosis without current pathological fracture: Secondary | ICD-10-CM

## 2024-02-12 DIAGNOSIS — K219 Gastro-esophageal reflux disease without esophagitis: Secondary | ICD-10-CM

## 2024-02-12 DIAGNOSIS — E039 Hypothyroidism, unspecified: Secondary | ICD-10-CM

## 2024-02-12 DIAGNOSIS — E538 Deficiency of other specified B group vitamins: Secondary | ICD-10-CM

## 2024-02-12 DIAGNOSIS — E785 Hyperlipidemia, unspecified: Secondary | ICD-10-CM | POA: Diagnosis not present

## 2024-02-12 DIAGNOSIS — F3170 Bipolar disorder, currently in remission, most recent episode unspecified: Secondary | ICD-10-CM

## 2024-02-13 LAB — COMPREHENSIVE METABOLIC PANEL WITH GFR
AG Ratio: 1.7 (calc) (ref 1.0–2.5)
ALT: 18 U/L (ref 6–29)
AST: 28 U/L (ref 10–35)
Albumin: 4.3 g/dL (ref 3.6–5.1)
Alkaline phosphatase (APISO): 68 U/L (ref 37–153)
BUN: 15 mg/dL (ref 7–25)
CO2: 31 mmol/L (ref 20–32)
Calcium: 9.6 mg/dL (ref 8.6–10.4)
Chloride: 102 mmol/L (ref 98–110)
Creat: 0.68 mg/dL (ref 0.60–1.00)
Globulin: 2.5 g/dL (ref 1.9–3.7)
Glucose, Bld: 82 mg/dL (ref 65–139)
Potassium: 3.8 mmol/L (ref 3.5–5.3)
Sodium: 141 mmol/L (ref 135–146)
Total Bilirubin: 0.6 mg/dL (ref 0.2–1.2)
Total Protein: 6.8 g/dL (ref 6.1–8.1)
eGFR: 91 mL/min/{1.73_m2}

## 2024-02-13 LAB — CBC WITH DIFFERENTIAL/PLATELET
Absolute Lymphocytes: 2008 {cells}/uL (ref 850–3900)
Absolute Monocytes: 271 {cells}/uL (ref 200–950)
Basophils Absolute: 9 {cells}/uL (ref 0–200)
Basophils Relative: 0.2 %
Eosinophils Absolute: 211 {cells}/uL (ref 15–500)
Eosinophils Relative: 4.9 %
HCT: 37.6 % (ref 35.9–46.0)
Hemoglobin: 11.9 g/dL (ref 11.7–15.5)
MCH: 29.2 pg (ref 27.0–33.0)
MCHC: 31.6 g/dL (ref 31.6–35.4)
MCV: 92.4 fL (ref 81.4–101.7)
MPV: 11.1 fL (ref 7.5–12.5)
Monocytes Relative: 6.3 %
Neutro Abs: 1802 {cells}/uL (ref 1500–7800)
Neutrophils Relative %: 41.9 %
Platelets: 164 10*3/uL (ref 140–400)
RBC: 4.07 Million/uL (ref 3.80–5.10)
RDW: 14.5 % (ref 11.0–15.0)
Total Lymphocyte: 46.7 %
WBC: 4.3 10*3/uL (ref 3.8–10.8)

## 2024-02-13 LAB — LIPID PANEL
Cholesterol: 166 mg/dL
HDL: 70 mg/dL
LDL Cholesterol (Calc): 79 mg/dL
Non-HDL Cholesterol (Calc): 96 mg/dL
Total CHOL/HDL Ratio: 2.4 (calc)
Triglycerides: 85 mg/dL

## 2024-02-13 LAB — TSH: TSH: 6.82 m[IU]/L — ABNORMAL HIGH (ref 0.40–4.50)

## 2024-02-13 LAB — VITAMIN B12: Vitamin B-12: 596 pg/mL (ref 200–1100)

## 2024-02-13 LAB — VITAMIN D 25 HYDROXY (VIT D DEFICIENCY, FRACTURES): Vit D, 25-Hydroxy: 73 ng/mL (ref 30–100)

## 2024-02-17 ENCOUNTER — Telehealth: Payer: Self-pay

## 2024-02-17 ENCOUNTER — Ambulatory Visit: Payer: Self-pay | Admitting: Family

## 2024-02-17 NOTE — Telephone Encounter (Signed)
 See lab results

## 2024-02-17 NOTE — Telephone Encounter (Signed)
 Copied from CRM [id number]. Topic: Clinical - Lab/Test Results >> Feb 16, 2024  4:27 PM Susanna ORN wrote: Reason for CRM: Patient states that she received her lab results and she wants to know what does Dinah suggest they need to do about her thyroids? Please give patient a call back to advise. CB #: [id number].

## 2024-02-17 NOTE — Telephone Encounter (Signed)
 Left message on voicemail for patient to return call when available.Please [id number] to share the results with the patient from the provider. If they question or concerns please to call the office

## 2024-07-05 ENCOUNTER — Encounter: Payer: Self-pay | Admitting: Family

## 2024-07-08 ENCOUNTER — Ambulatory Visit: Admitting: Family

## 2024-08-11 ENCOUNTER — Ambulatory Visit: Admitting: Family
